# Patient Record
Sex: Female | Born: 2000 | Race: White | Hispanic: Yes | Marital: Single | Smoking: Never smoker
Health system: Southern US, Community
[De-identification: ages and names within clinical notes are randomized; demographics above are authoritative.]

## PROBLEM LIST (undated history)

## (undated) ENCOUNTER — Inpatient Hospital Stay: Payer: Self-pay

## (undated) DIAGNOSIS — D649 Anemia, unspecified: Secondary | ICD-10-CM

## (undated) DIAGNOSIS — J45909 Unspecified asthma, uncomplicated: Secondary | ICD-10-CM

## (undated) HISTORY — PX: TONSILLECTOMY: SUR1361

---
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
## 2022-09-08 IMAGING — US US OB COMP LESS 14 WK
1 series · 15 of 28 positions shown · non-contrast
Comparison: None.

CLINICAL DATA: Unsure dates of pregnancy. Gestational age by unsure
last menstrual period of 15 weeks and 6 days.

EXAM:
OBSTETRIC <14 WK US AND TRANSVAGINAL OB US
TECHNIQUE: Both transabdominal and transvaginal ultrasound examinations were
performed for complete evaluation of the gestation as well as the
maternal uterus, adnexal regions, and pelvic cul-de-sac.
Transvaginal technique was performed to assess early pregnancy.

[Series 1: us ob comp less 14 wk · 46 acquisitions, 15 frames shown]
[im 1/46]
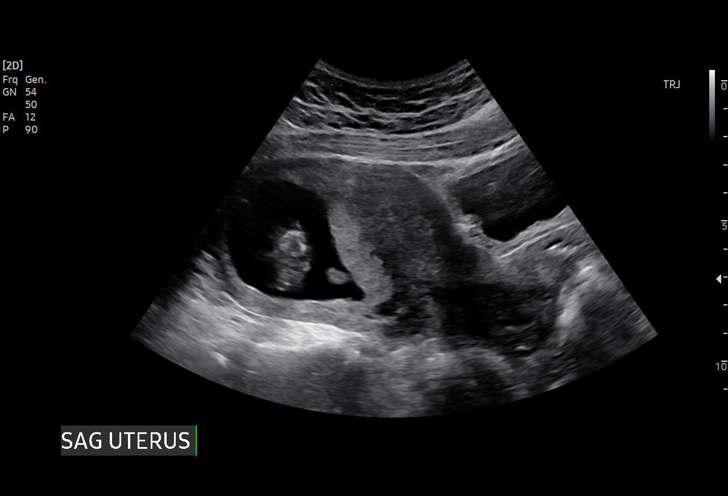
[im 4/46]
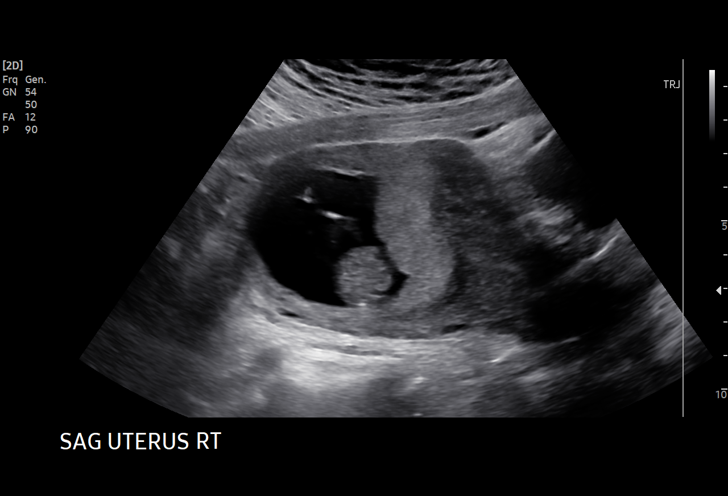
[im 7/46]
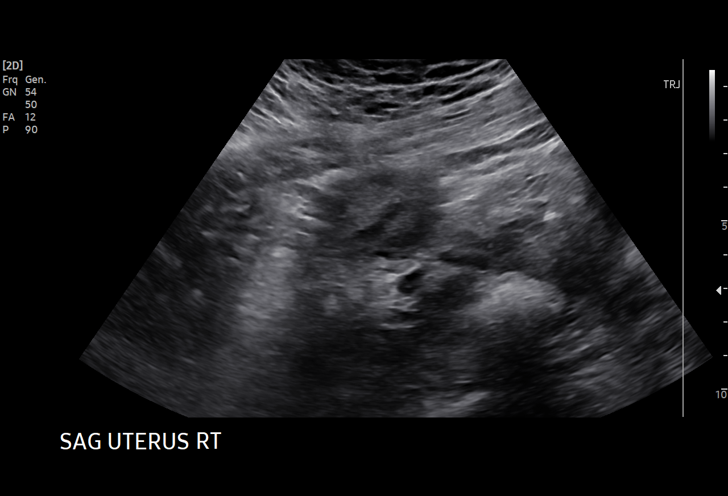
[im 11/46]
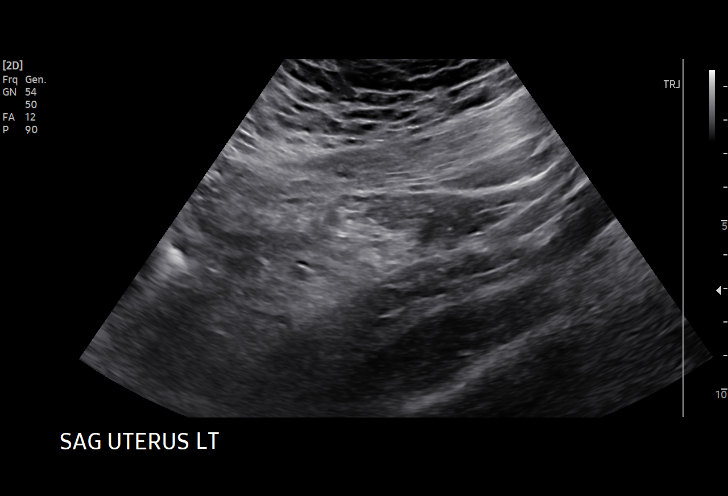
[im 14/46]
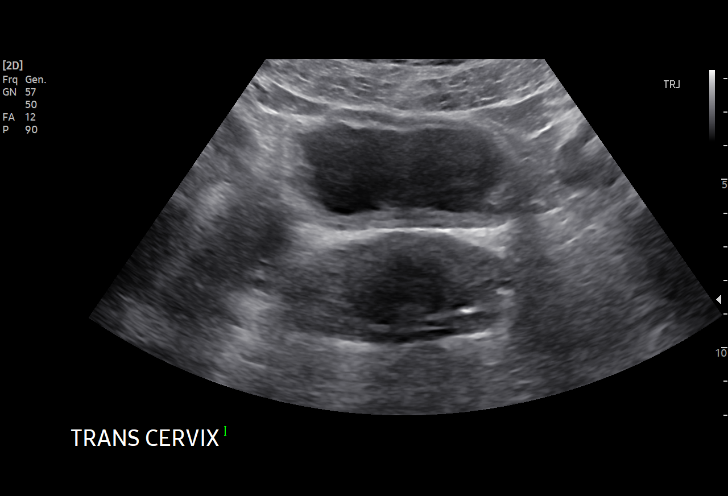
[im 17/46]
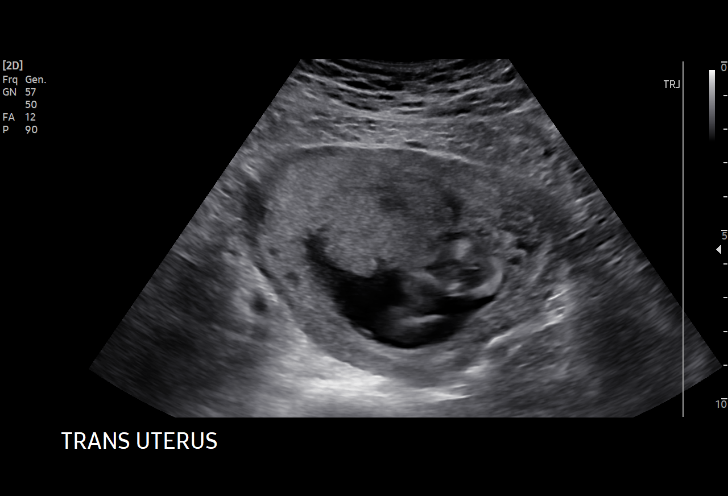
[im 21/46]
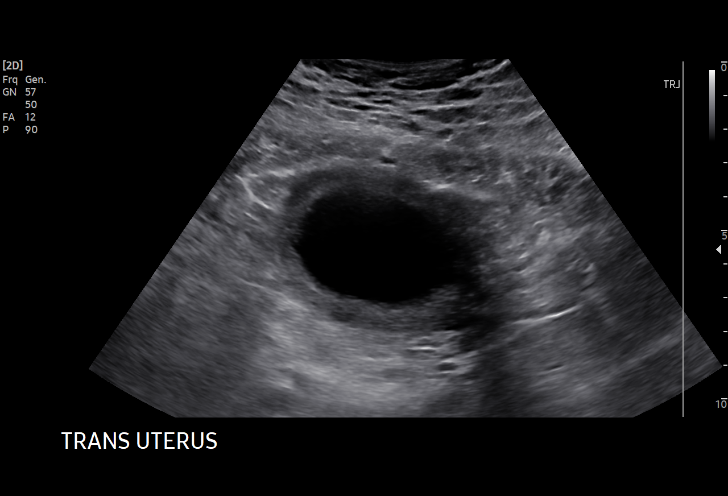
[im 24/46]
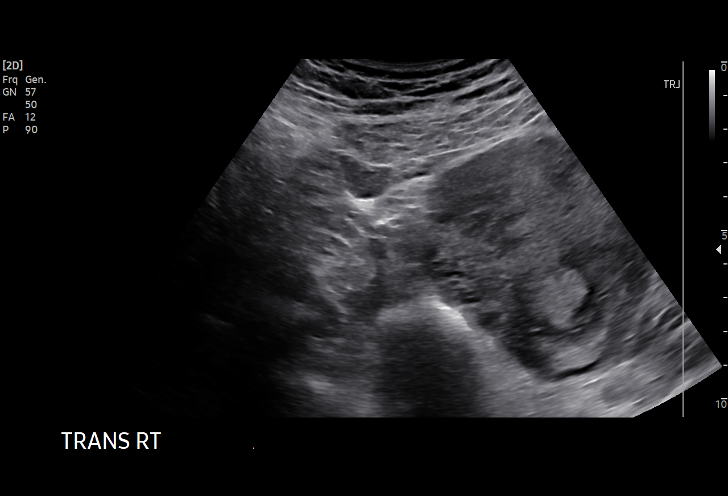
[im 26/46]
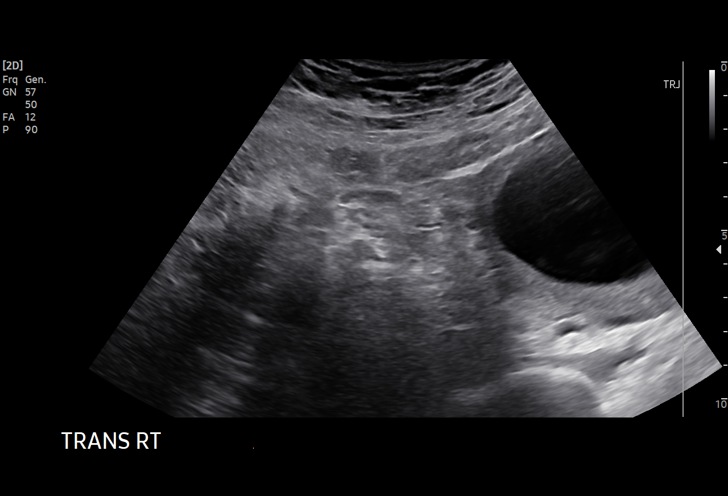
[im 29/46]
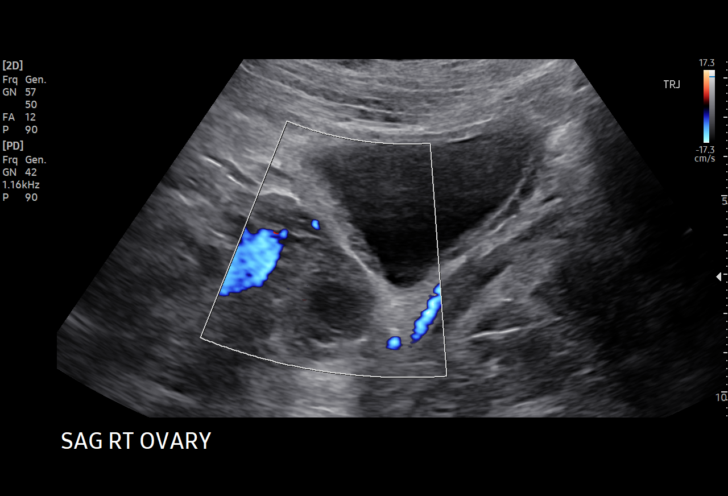
[im 32/46]
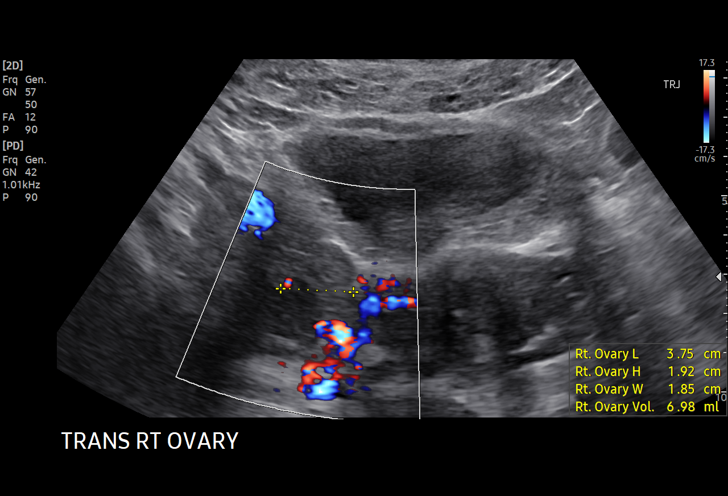
[im 36/46]
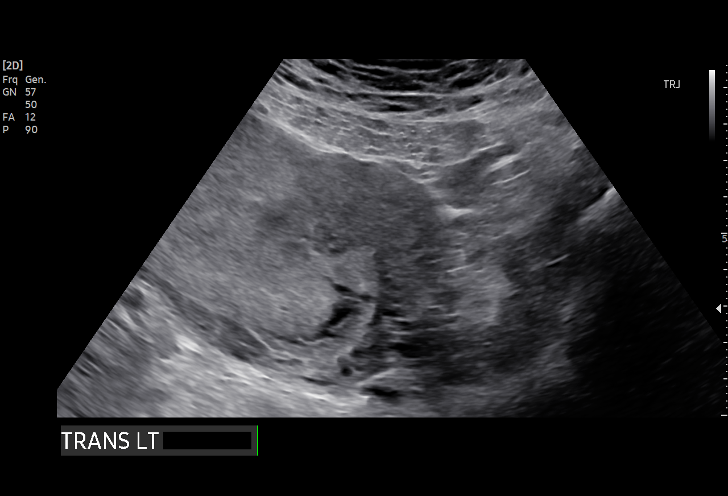
[im 39/46]
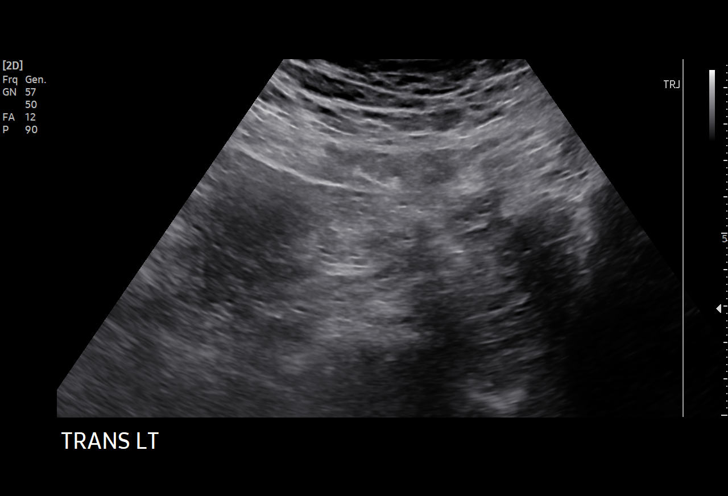
[im 42/46]
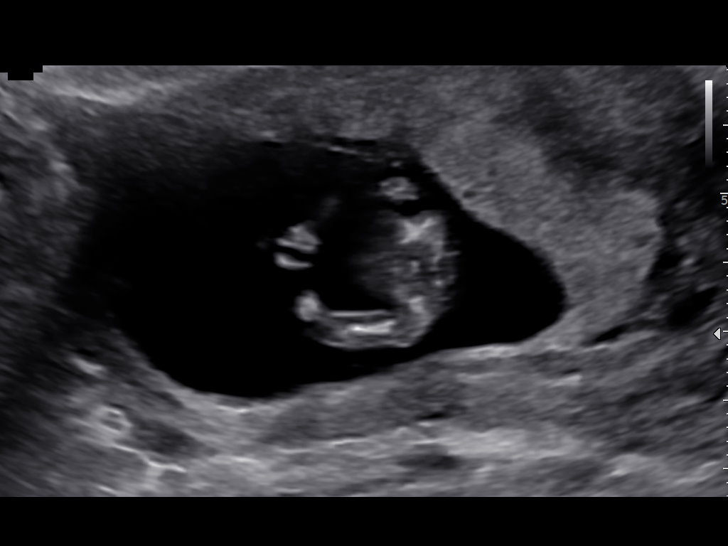
[im 46/46]
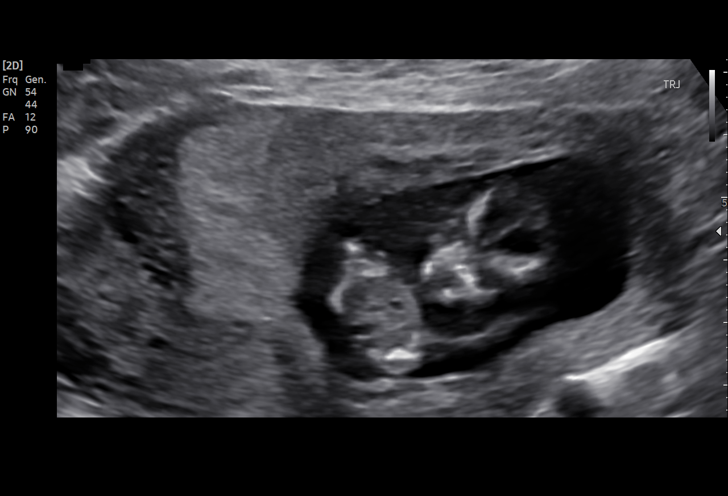

[15 of 28 positions shown; findings below may reference images not displayed]

FINDINGS: Intrauterine gestational sac: Single

Yolk sac:  Not Visualized.

Embryo:  Visualized.

Cardiac Activity: Visualized.

Heart Rate: 150 bpm

CRL:  60.4 mm   12 w   4 d                  US EDC: 01/13/2021

Subchorionic hemorrhage:  None visualized.

Maternal uterus/adnexae: The left ovary is not visualized. No
adnexal mass noted in the left adnexa. A corpus luteum cyst is noted
within the right ovary. The right ovary is otherwise unremarkable.

Other: No free pelvic fluid.
IMPRESSION: Single live intrauterine pregnancy with a gestational age of 12
weeks and 4 days by ultrasound which is not concordant with
gestational age by last menstrual period of 15 weeks and 6 days.

## 2022-10-17 IMAGING — CR DG CHEST 2V
1 series · 2 of 2 positions shown · non-contrast
Comparison: None.

CLINICAL DATA: Chest pain and shortness of breath.

EXAM:
CHEST - 2 VIEW

[Series 1: dg chest 2 view · 0.14mm/px · 2 of 2 slices shown]
[im 1/2]
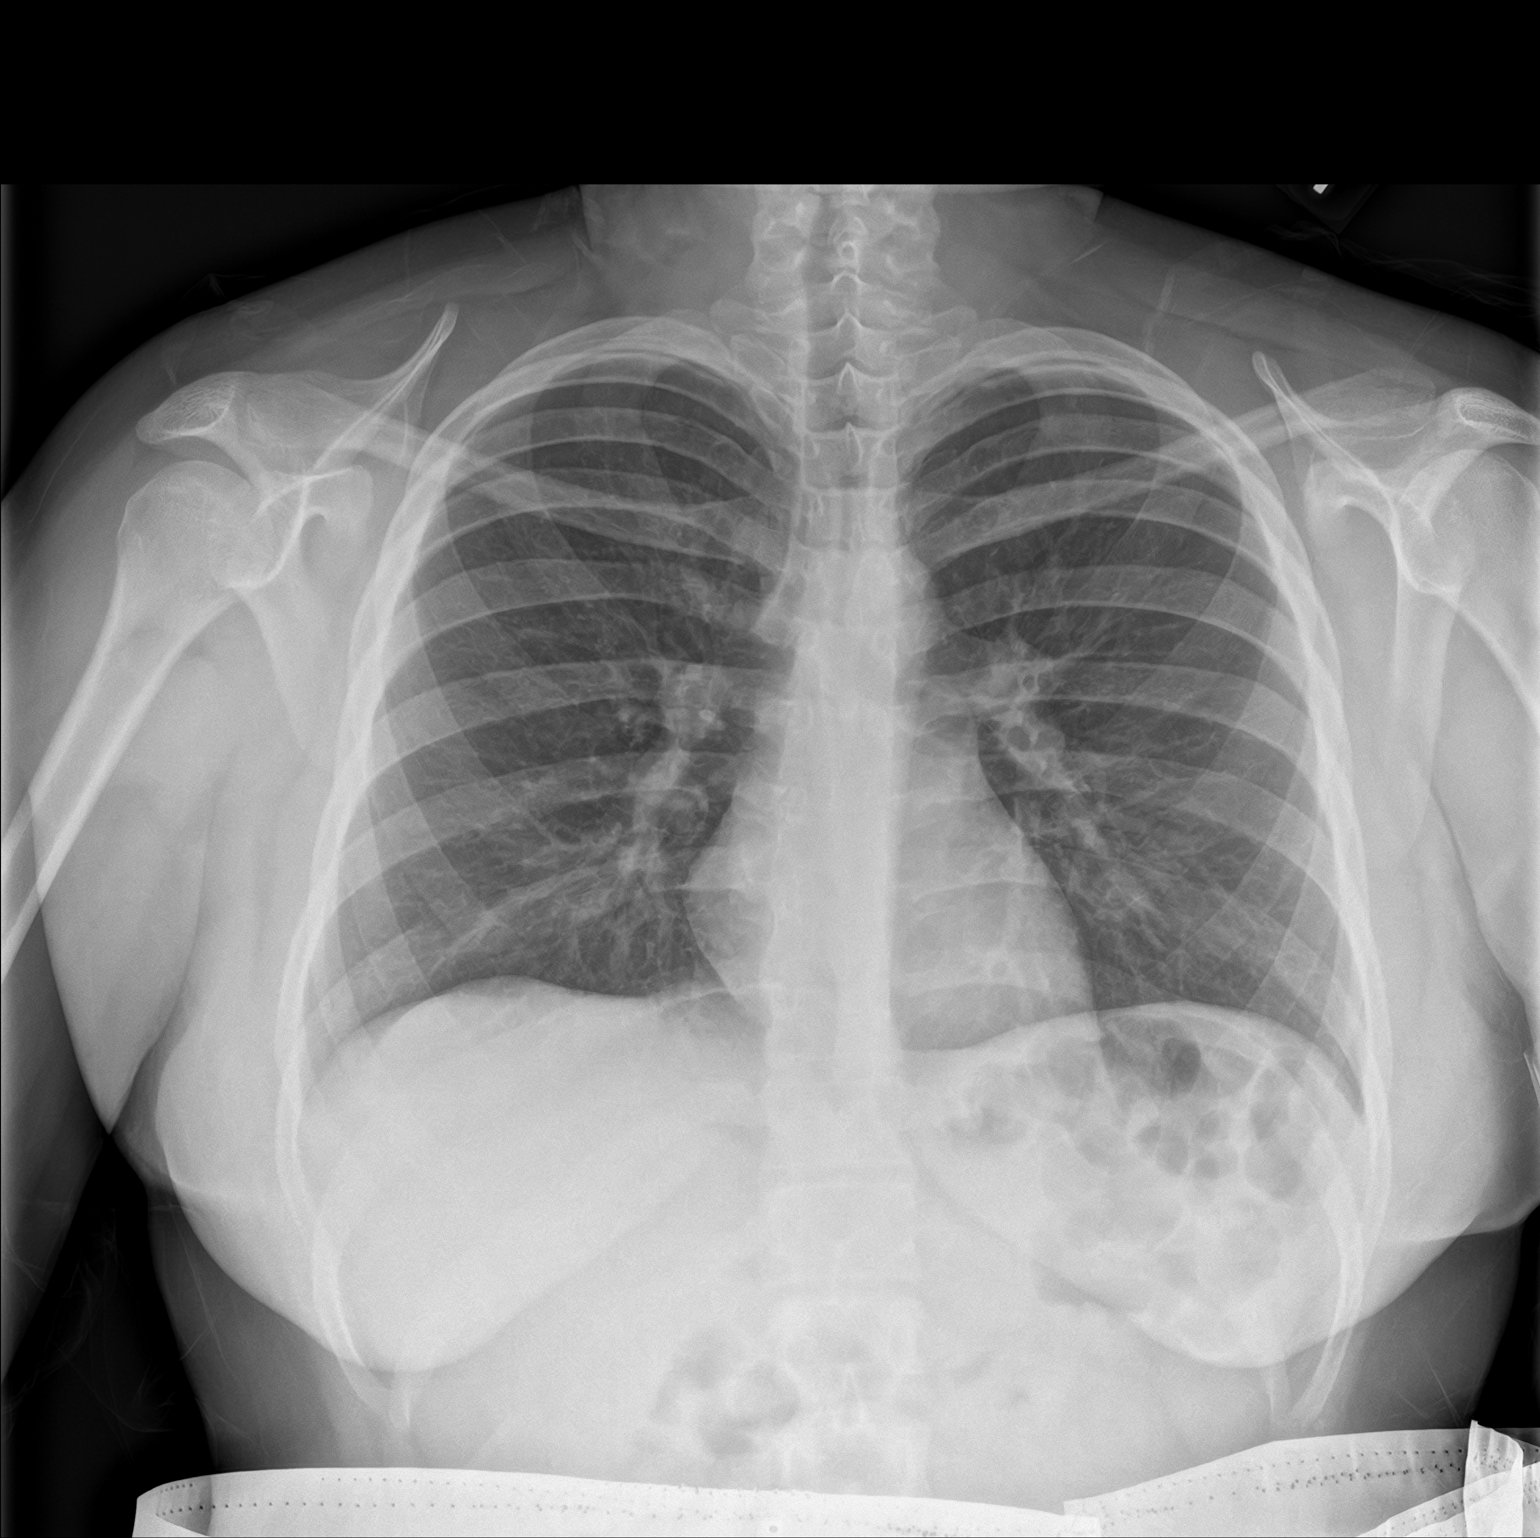
[im 2/2]
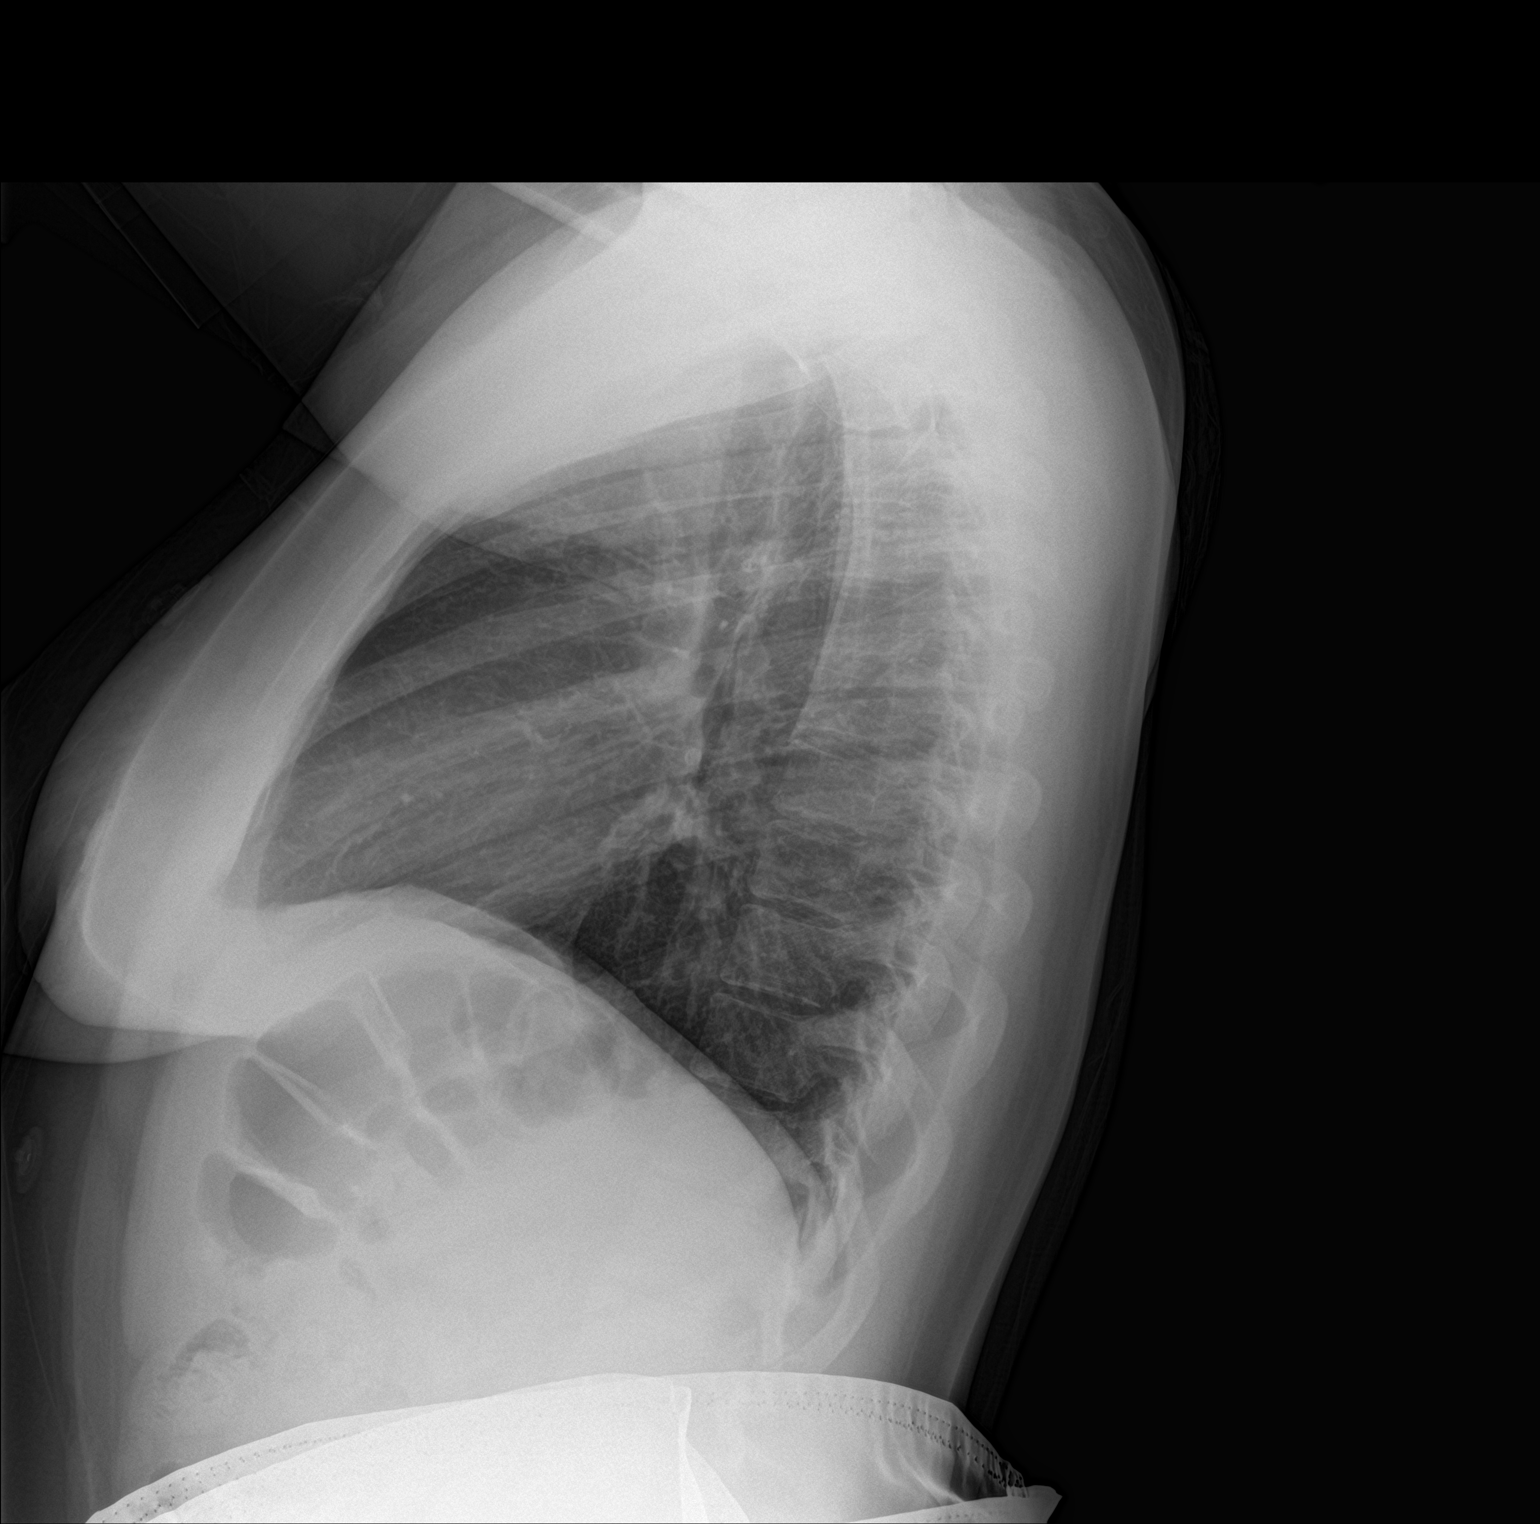

[2 of 2 positions shown; findings below may reference images not displayed]

FINDINGS: The heart size and mediastinal contours are within normal limits.
Both lungs are clear. The visualized skeletal structures are
unremarkable.
IMPRESSION: No active cardiopulmonary disease.

## 2022-12-18 ENCOUNTER — Other Ambulatory Visit: Payer: Self-pay | Admitting: Family Medicine

## 2022-12-18 DIAGNOSIS — Z3201 Encounter for pregnancy test, result positive: Secondary | ICD-10-CM

## 2022-12-27 ENCOUNTER — Other Ambulatory Visit: Payer: Self-pay | Admitting: Family Medicine

## 2022-12-27 ENCOUNTER — Ambulatory Visit
Admission: RE | Admit: 2022-12-27 | Discharge: 2022-12-27 | Disposition: A | Discharge status: Home / Self Care | Payer: Medicaid Other | Source: Ambulatory Visit | Attending: Family Medicine | Admitting: Family Medicine

## 2022-12-27 DIAGNOSIS — Z3201 Encounter for pregnancy test, result positive: Secondary | ICD-10-CM | POA: Diagnosis present

## 2023-01-11 LAB — OB RESULTS CONSOLE RUBELLA ANTIBODY, IGM: Rubella: IMMUNE

## 2023-01-11 LAB — OB RESULTS CONSOLE HEPATITIS B SURFACE ANTIGEN: Hepatitis B Surface Ag: NEGATIVE

## 2023-01-11 LAB — OB RESULTS CONSOLE GC/CHLAMYDIA
Chlamydia: NEGATIVE
Neisseria Gonorrhea: NEGATIVE

## 2023-01-11 LAB — OB RESULTS CONSOLE HIV ANTIBODY (ROUTINE TESTING): HIV: NONREACTIVE

## 2023-01-11 LAB — OB RESULTS CONSOLE VARICELLA ZOSTER ANTIBODY, IGG: Varicella: IMMUNE

## 2023-01-17 NOTE — L&D Delivery Note (Signed)
 Delivery Note  Date of delivery: 06/22/2023 Estimated Date of Delivery: 06/16/23 Patient's last menstrual period was 09/12/2022 (approximate). EGA: [redacted]w[redacted]d  Delivery Note At 4:00 PM a viable female was delivered via Vaginal, Spontaneous (Presentation: Left Occiput Anterior).  APGAR: 8, 9; weight pending.  Placenta status: Spontaneous, Intact.  Cord: 3 vessels with the following complications: None.    First Stage: Labor onset: unknown Augmentation : Pitocin , AROM Analgesia Darcie Easterly intrapartum: Epidural AROM at 1440  Yadhira M Torres Hernandez presented to L&D with SROM. She was augmented with pitocin  and AROM. Epidural placed for pain relief.   Second Stage: Complete dilation at 1556 Onset of pushing at 1556 FHR second stage Cat II Delivery at 1600 on 06/22/2023  She progressed to complete and had a spontaneous vaginal birth of a live female over an intact perineum. The fetal head was delivered in OA position with restitution to LOA. No nuchal cord. Anterior then posterior shoulders delivered spontaneously with minimal assistance. Baby placed on mom's abdomen and attended to by transition RN. Cord clamped and cut after 1+ min by FOB. Cord blood obtained for newborn labs.  Third Stage: Placenta delivered intact with 3VC at 1605 Placenta disposition: routine disposal Uterine tone firm / bleeding min IV pitocin  given for hemorrhage prophylaxis  Anesthesia: Epidural Episiotomy: None Lacerations: Labial Suture Repair: 3.0 vicryl Est. Blood Loss (mL):  100  Complications: none  Mom to postpartum.  Baby to Couplet care / Skin to Skin.  Newborn: Birth Weight: pending  Apgar Scores: 8, 9 Feeding planned: Bottle   Aron Lard, CNM 06/22/2023 4:20 PM

## 2023-01-18 ENCOUNTER — Other Ambulatory Visit: Payer: Self-pay | Admitting: Primary Care

## 2023-01-18 DIAGNOSIS — Z3482 Encounter for supervision of other normal pregnancy, second trimester: Secondary | ICD-10-CM

## 2023-02-05 ENCOUNTER — Ambulatory Visit
Admission: RE | Admit: 2023-02-05 | Discharge: 2023-02-05 | Disposition: A | Discharge status: Home / Self Care | Payer: Medicaid Other | Source: Ambulatory Visit | Attending: Primary Care | Admitting: Primary Care

## 2023-02-05 DIAGNOSIS — Z3482 Encounter for supervision of other normal pregnancy, second trimester: Secondary | ICD-10-CM | POA: Diagnosis present

## 2023-02-05 DIAGNOSIS — Z3A21 21 weeks gestation of pregnancy: Secondary | ICD-10-CM | POA: Diagnosis not present

## 2023-02-05 DIAGNOSIS — Z3689 Encounter for other specified antenatal screening: Secondary | ICD-10-CM | POA: Insufficient documentation

## 2023-05-01 ENCOUNTER — Encounter: Payer: Self-pay | Admitting: Obstetrics and Gynecology

## 2023-05-01 ENCOUNTER — Observation Stay
Admission: EM | Admit: 2023-05-01 | Discharge: 2023-05-01 | Disposition: A | Discharge status: Home / Self Care | Attending: Obstetrics and Gynecology | Admitting: Obstetrics and Gynecology

## 2023-05-01 DIAGNOSIS — Z79899 Other long term (current) drug therapy: Secondary | ICD-10-CM | POA: Diagnosis not present

## 2023-05-01 DIAGNOSIS — O4703 False labor before 37 completed weeks of gestation, third trimester: Secondary | ICD-10-CM | POA: Diagnosis present

## 2023-05-01 DIAGNOSIS — O99513 Diseases of the respiratory system complicating pregnancy, third trimester: Secondary | ICD-10-CM | POA: Diagnosis not present

## 2023-05-01 DIAGNOSIS — O26893 Other specified pregnancy related conditions, third trimester: Secondary | ICD-10-CM | POA: Diagnosis present

## 2023-05-01 DIAGNOSIS — Z3A33 33 weeks gestation of pregnancy: Secondary | ICD-10-CM | POA: Insufficient documentation

## 2023-05-01 DIAGNOSIS — M545 Low back pain, unspecified: Secondary | ICD-10-CM | POA: Insufficient documentation

## 2023-05-01 DIAGNOSIS — J45909 Unspecified asthma, uncomplicated: Secondary | ICD-10-CM | POA: Diagnosis not present

## 2023-05-01 DIAGNOSIS — O479 False labor, unspecified: Principal | ICD-10-CM | POA: Diagnosis present

## 2023-05-01 DIAGNOSIS — O99891 Other specified diseases and conditions complicating pregnancy: Secondary | ICD-10-CM | POA: Insufficient documentation

## 2023-05-01 LAB — URINALYSIS, COMPLETE (UACMP) WITH MICROSCOPIC
Bilirubin Urine: NEGATIVE
Glucose, UA: NEGATIVE mg/dL
Hgb urine dipstick: NEGATIVE
Ketones, ur: 5 mg/dL — AB
Nitrite: NEGATIVE
Protein, ur: 30 mg/dL — AB
Specific Gravity, Urine: 1.03 (ref 1.005–1.030)
pH: 6 (ref 5.0–8.0)

## 2023-05-01 MED ORDER — NITROFURANTOIN MONOHYD MACRO 100 MG PO CAPS: 100.0000 mg | ORAL_CAPSULE | Freq: Two times a day (BID) | ORAL | 0 refills | Status: AC

## 2023-05-01 MED ORDER — NITROFURANTOIN MONOHYD MACRO 100 MG PO CAPS
100.0000 mg | ORAL_CAPSULE | Freq: Two times a day (BID) | ORAL | Status: DC
  Administered 2023-05-01: 100 mg via ORAL
  Filled 2023-05-01: qty 1

## 2023-05-01 MED ORDER — ZOLPIDEM TARTRATE 5 MG PO TABS: 5.0000 mg | ORAL_TABLET | Freq: Every evening | ORAL | Status: DC | PRN

## 2023-05-01 MED ORDER — ACETAMINOPHEN 500 MG PO TABS
ORAL_TABLET | ORAL | Status: DC
Start: 2023-05-01 — End: 2023-05-02
  Filled 2023-05-01: qty 2

## 2023-05-01 MED ORDER — ACETAMINOPHEN 500 MG PO TABS
1000.0000 mg | ORAL_TABLET | Freq: Four times a day (QID) | ORAL | Status: DC | PRN
  Administered 2023-05-01: 1000 mg via ORAL

## 2023-05-01 MED ORDER — CALCIUM CARBONATE ANTACID 500 MG PO CHEW: 2.0000 | CHEWABLE_TABLET | ORAL | Status: DC | PRN

## 2023-05-01 NOTE — OB Triage Note (Signed)
 Pt is G2p1 at approx 33 weeks with c/o headache, upper abd cramping and intermittent back pain. States she had PTL with first baby but delivered at term. Reports + FM FHR 145

## 2023-05-01 NOTE — Discharge Instructions (Signed)

## 2023-05-01 NOTE — Discharge Summary (Signed)
 Monique Hansen Monique Hansen is a 23 y.o. female. She is at [redacted]w[redacted]d gestation. No LMP recorded. Patient is pregnant. 06/16/2023, by Other Basis   Prenatal care site: Monique Hansen  Chief complaint: lower back pain and contractions   Admission Diagnoses:  1) intrauterine pregnancy at [redacted]w[redacted]d  2) Uterine contractions during pregnancy [O47.9]  Discharge Diagnoses:  Principal Problem:   Uterine contractions during pregnancy Active Problems:   Low back pain during pregnancy in third trimester   HPI: Monique Hansen presents to L&D with complaints of contractions and lower back pain. Started earlier this evening around 1800 after work. She reports a history of preterm contractions in her previous pregnancy but delivered at term. Her pregnancy is complicated by obesity. Currently receives care with Monique Hansen. Records available through January 2025 She denies Loss of fluid or Vaginal bleeding. Endorses fetal movement as active. States contractions are less frequent and back pain has improved since arrival to L&D  S: Resting comfortably., no VB.no LOF,  Active fetal movement.   Maternal Medical History:  Past Medical Hx:  has a past medical history of Anemia and Asthma.    Past Surgical Hx:  has a past surgical history that includes Tonsillectomy.   Allergies  Allergen Reactions   Ibuprofen Hives     Prior to Admission medications   Medication Sig Start Date End Date Taking? Authorizing Provider  acetaminophen (TYLENOL) 500 MG tablet Take 2 tablets (1,000 mg total) by mouth every 6 (six) hours as needed (for pain scale < 4). 01/18/21   Auston Left, CNM  albuterol (PROVENTIL) (2.5 MG/3ML) 0.083% nebulizer solution Take 2.5 mg by nebulization as needed for wheezing or shortness of breath.    [provider]  benzocaine-Menthol (DERMOPLAST) 20-0.5 % AERO Apply 1 application topically as needed for irritation (perineal discomfort). 01/18/21   Auston Left, CNM  diphenhydrAMINE  (BENADRYL) 25 MG tablet Take 2 tablets (50 mg total) by mouth every 6 (six) hours as needed for itching. 12/11/20   McVey, Georges Kings, CNM  ferrous sulfate 325 (65 FE) MG tablet Take 1 tablet (325 mg total) by mouth 2 (two) times daily with a meal. 01/18/21   Wilson, Prudy Brownie, CNM  nitrofurantoin, macrocrystal-monohydrate, (MACROBID) 100 MG capsule Take 1 capsule (100 mg total) by mouth every 12 (twelve) hours for 5 days. 05/01/23 05/06/23  Teodora Fell, CNM  Prenatal Vit-Fe Fumarate-FA (MULTIVITAMIN-PRENATAL) 27-0.8 MG TABS tablet Take 1 tablet by mouth daily at 12 noon.    [provider]  sodium chloride (OCEAN) 0.65 % SOLN nasal spray Place 1 spray into both nostrils as needed for congestion. 12/11/20   McVey, Georges Kings, CNM  witch hazel-glycerin (TUCKS) pad Apply 1 application topically as needed for hemorrhoids (for pain). 01/18/21   Auston Left, CNM    Social History: She  reports that she has never smoked. She has never used smokeless tobacco. She reports that she does not currently use alcohol. She reports that she does not use drugs.  Family History: family history is not on file.   Review of Systems: A full review of systems was performed and negative except as noted in the HPI.     Pertinent Results:   O:  Temp 98.2 F (36.8 C)   Resp 16   Ht 5\' 2"  (1.575 m)   Wt 95.3 kg   BMI 38.41 kg/m  Results for orders placed or performed during the hospital encounter of 05/01/23 (from the past 48 hours)  Urinalysis,  Complete w Microscopic -Urine, Clean Catch   Collection Time: 05/01/23  9:25 PM  Result Value Ref Range   Color, Urine YELLOW (A) YELLOW   APPearance HAZY (A) CLEAR   Specific Gravity, Urine 1.030 1.005 - 1.030   pH 6.0 5.0 - 8.0   Glucose, UA NEGATIVE NEGATIVE mg/dL   Hgb urine dipstick NEGATIVE NEGATIVE   Bilirubin Urine NEGATIVE NEGATIVE   Ketones, ur 5 (A) NEGATIVE mg/dL   Protein, ur 30 (A) NEGATIVE mg/dL   Nitrite NEGATIVE NEGATIVE    Leukocytes,Ua TRACE (A) NEGATIVE   RBC / HPF 0-5 0 - 5 RBC/hpf   WBC, UA 6-10 0 - 5 WBC/hpf   Bacteria, UA RARE (A) NONE SEEN   Squamous Epithelial / HPF 6-10 0 - 5 /HPF   Mucus PRESENT     No results found.  Constitutional: NAD, AAOx3  PULM: nl respiratory effort Abd: gravid, non-tender Ext: Non-tender, Nonedmeatous Psych: mood appropriate, speech normal SVE: Dilation: Closed Exam by:: MackieCNM   NST: Baseline FHR: 135 beats/min Variability: moderate Accelerations: present Decelerations: absent Tocometry: Occasional, mild contractions  Time: at least 20 minutes   Interpretation: Category I INDICATIONS: rule out uterine contractions RESULTS:  A NST procedure was performed with FHR monitoring and a normal baseline established, appropriate time of 20-40 minutes of evaluation, and accels >2 seen w 15x15 characteristics.  Results show a REACTIVE NST.   Consults: None  Procedures: NST  Hospital Course: The patient was admitted to Labor and Delivery Triage for observation. She reported improvement in her symptoms with rest and hydration. States contractions were infrequent and back pain had improved. Discussed possible UTI based on UA results. Urine culture pending. Will start Macrobid now while urine culture is pending. Reviewed preterm labor warning s/s to return to L&D with.   She was deemed Monique for discharge and further outpatient management.   Discharge Condition: Monique  Disposition: Discharge disposition: 01-Home or Self Care       Allergies as of 05/01/2023       Reactions   Ibuprofen Hives        Medication List     STOP taking these medications    benzocaine-Menthol 20-0.5 % Aero Commonly known as: DERMOPLAST   witch hazel-glycerin pad Commonly known as: TUCKS       TAKE these medications    acetaminophen 500 MG tablet Commonly known as: TYLENOL Take 2 tablets (1,000 mg total) by mouth every 6 (six) hours as needed (for pain scale < 4).    albuterol (2.5 MG/3ML) 0.083% nebulizer solution Commonly known as: PROVENTIL Take 2.5 mg by nebulization as needed for wheezing or shortness of breath.   diphenhydrAMINE 25 MG tablet Commonly known as: BENADRYL Take 2 tablets (50 mg total) by mouth every 6 (six) hours as needed for itching.   ferrous sulfate 325 (65 FE) MG tablet Take 1 tablet (325 mg total) by mouth 2 (two) times daily with a meal.   multivitamin-prenatal 27-0.8 MG Tabs tablet Take 1 tablet by mouth daily at 12 noon.   nitrofurantoin (macrocrystal-monohydrate) 100 MG capsule Commonly known as: MACROBID Take 1 capsule (100 mg total) by mouth every 12 (twelve) hours for 5 days.   sodium chloride 0.65 % Soln nasal spray Commonly known as: OCEAN Place 1 spray into both nostrils as needed for congestion.        Follow-up Information     [facility]. Go in 1 day(s).   Specialty: General Practice Why: routine  prenatal appointment Contact information: [address] [place] [address] [phone]                ----- Teodora Fell, CNM  Certified Nurse Midwife [place]  [facility]

## 2023-05-03 LAB — URINE CULTURE

## 2023-05-11 ENCOUNTER — Encounter (HOSPITAL_BASED_OUTPATIENT_CLINIC_OR_DEPARTMENT_OTHER): Payer: Self-pay | Admitting: Primary Care

## 2023-05-11 ENCOUNTER — Other Ambulatory Visit: Payer: Self-pay | Admitting: Primary Care

## 2023-05-11 DIAGNOSIS — Z3483 Encounter for supervision of other normal pregnancy, third trimester: Secondary | ICD-10-CM

## 2023-05-22 ENCOUNTER — Ambulatory Visit

## 2023-05-22 LAB — OB RESULTS CONSOLE GBS
GBS: POSITIVE
GBS: POSITIVE

## 2023-05-31 ENCOUNTER — Ambulatory Visit
Admission: RE | Admit: 2023-05-31 | Discharge: 2023-05-31 | Disposition: A | Discharge status: Home / Self Care | Source: Ambulatory Visit | Attending: Primary Care | Admitting: Primary Care

## 2023-05-31 DIAGNOSIS — Z3483 Encounter for supervision of other normal pregnancy, third trimester: Secondary | ICD-10-CM | POA: Diagnosis present

## 2023-06-20 ENCOUNTER — Other Ambulatory Visit: Payer: Self-pay

## 2023-06-20 ENCOUNTER — Observation Stay
Admission: EM | Admit: 2023-06-20 | Discharge: 2023-06-20 | Disposition: A | Discharge status: Home / Self Care | Attending: Obstetrics and Gynecology | Admitting: Obstetrics and Gynecology

## 2023-06-20 ENCOUNTER — Observation Stay
Admission: EM | Admit: 2023-06-20 | Discharge: 2023-06-20 | Disposition: A | Discharge status: Home / Self Care | Source: Home / Self Care | Admitting: Obstetrics and Gynecology

## 2023-06-20 DIAGNOSIS — E669 Obesity, unspecified: Secondary | ICD-10-CM | POA: Diagnosis not present

## 2023-06-20 DIAGNOSIS — O480 Post-term pregnancy: Principal | ICD-10-CM | POA: Diagnosis present

## 2023-06-20 DIAGNOSIS — Z79899 Other long term (current) drug therapy: Secondary | ICD-10-CM | POA: Diagnosis not present

## 2023-06-20 DIAGNOSIS — O99513 Diseases of the respiratory system complicating pregnancy, third trimester: Secondary | ICD-10-CM | POA: Diagnosis not present

## 2023-06-20 DIAGNOSIS — M549 Dorsalgia, unspecified: Secondary | ICD-10-CM | POA: Insufficient documentation

## 2023-06-20 DIAGNOSIS — J45909 Unspecified asthma, uncomplicated: Secondary | ICD-10-CM | POA: Insufficient documentation

## 2023-06-20 DIAGNOSIS — O99213 Obesity complicating pregnancy, third trimester: Secondary | ICD-10-CM | POA: Diagnosis not present

## 2023-06-20 DIAGNOSIS — M545 Low back pain, unspecified: Secondary | ICD-10-CM | POA: Diagnosis not present

## 2023-06-20 DIAGNOSIS — O479 False labor, unspecified: Secondary | ICD-10-CM | POA: Diagnosis present

## 2023-06-20 DIAGNOSIS — O26893 Other specified pregnancy related conditions, third trimester: Secondary | ICD-10-CM | POA: Insufficient documentation

## 2023-06-20 DIAGNOSIS — O471 False labor at or after 37 completed weeks of gestation: Principal | ICD-10-CM | POA: Insufficient documentation

## 2023-06-20 DIAGNOSIS — Z3A40 40 weeks gestation of pregnancy: Secondary | ICD-10-CM | POA: Diagnosis not present

## 2023-06-20 NOTE — OB Triage Note (Signed)
 Pt is G2P1 at 40.4 with c/o intermittent UCs since 0530am. Nothing consistent. Has a scheduled appoint today at 3pm. FHR 135

## 2023-06-20 NOTE — OB Triage Note (Signed)
Pt discharged home per order.   Pt stable and ambulatory and an After Visit Summary was printed and given to the patient. Discharge education completed with patient/family including follow up instructions, appointments, and medication list. Pt received labor and bleeding precautions. Patient able to verbalize understanding, all questions fully answered upon discharge.   Pt discharged home via personal vehicle with support person.  

## 2023-06-20 NOTE — Progress Notes (Signed)
   06/20/23 1728  Fetal Heart Rate A  Mode External  Baseline Rate (A) 130 bpm  Variability 6-25 BPM  Accelerations 15 x 15  Decelerations None  Uterine Activity  Mode Toco  Contraction Frequency (min) ctx x2  Contraction Duration (sec) 60-120  Contraction Quality Mild  Resting Tone Palpated Relaxed  Resting Time Adequate   Reactive fetal tracing. Provider reviewed.

## 2023-06-20 NOTE — Discharge Summary (Signed)
 Monique Hansen Stable Monique Hansen is a 23 y.o. female. She is at [redacted]w[redacted]d gestation. Patient's last menstrual period was 09/12/2022 (approximate). 06/16/2023, by Ultrasound   Prenatal care site: [facility]  Chief complaint: back pain and would like to schedule induction of labor   Admission Diagnoses:  1) intrauterine pregnancy at [redacted]w[redacted]d  2) Post-dates pregnancy [O48.0]  Discharge Diagnoses:  Principal Problem:   Post-dates pregnancy  Back pain in pregnancy   HPI: [name] presents to L&D with complaints of lower back pain and desire to discuss induction of labor. She was seen by her prenatal provider this afternoon and request for induction of labor was sent to [facility], has not been scheduled yet.  States she had lower back pain and pelvic pressure. She was seen in L&D triage last night for similar concerns. Her pregnancy is uncomplicated.  She denies Contractions, Loss of fluid, or Vaginal bleeding. Endorses fetal movement as active.   S: Resting comfortably. no CTX, no VB.no LOF,  Active fetal movement.   Maternal Medical History:  Past Medical Hx:  has a past medical history of Anemia and Asthma.    Past Surgical Hx:  has a past surgical history that includes Tonsillectomy.   Allergies  Allergen Reactions   Ibuprofen Hives     Prior to Admission medications   Medication Sig Start Date End Date Taking? Authorizing Provider  acetaminophen  (TYLENOL ) 500 MG tablet Take 2 tablets (1,000 mg total) by mouth every 6 (six) hours as needed (for pain scale < 4). 01/18/21   Auston Left, CNM  albuterol (PROVENTIL) (2.5 MG/3ML) 0.083% nebulizer solution Take 2.5 mg by nebulization as needed for wheezing or shortness of breath.    [provider]  diphenhydrAMINE  (BENADRYL ) 25 MG tablet Take 2 tablets (50 mg total) by mouth every 6 (six) hours as needed for itching. 12/11/20   McVey, Georges Kings, CNM  ferrous sulfate  325 (65 FE) MG tablet Take 1 tablet (325 mg total) by mouth 2 (two) times daily  with a meal. 01/18/21   Wilson, Prudy Brownie, CNM  Prenatal Vit-Fe Fumarate-FA (MULTIVITAMIN-PRENATAL) 27-0.8 MG TABS tablet Take 1 tablet by mouth daily at 12 noon.    [provider]  sodium chloride  (OCEAN) 0.65 % SOLN nasal spray Place 1 spray into both nostrils as needed for congestion. 12/11/20   McVey, Georges Kings, CNM    Social History: She  reports that she has never smoked. She has never used smokeless tobacco. She reports that she does not currently use alcohol. She reports that she does not use drugs.  Family History: family history is not on file.   Review of Systems: A full review of systems was performed and negative except as noted in the HPI.     Pertinent Results:   O:  Ht 5\' 2"  (1.575 m)   Wt 98.4 kg   LMP 09/12/2022 (Approximate)   BMI 39.69 kg/m  No results found for this or any previous visit (from the past 48 hours).  US  OB Follow Up Result Date: 06/10/2023 CLINICAL DATA:  Complete fetal anatomic survey EXAM: OBSTETRIC 14+ WK ULTRASOUND FOLLOW-UP FINDINGS: Number of Fetuses: 1 Heart Rate:  147 bpm Movement: Yes Presentation: Cephalic Previa: No Placental Location: Fundal Amniotic Fluid (Subjective): Normal Amniotic Fluid (Objective): Vertical pocket 5.6cm AFI 15.5 cm (5%ile= 7.5 cm, 95%= 24.4 cm for 37 wks) FETAL BIOMETRY BPD:  9.3cm 38w 0d HC:    34.4cm 39w 5d AC:    35.1cm 39w 0d FL:  7.2cm 36w 6d Current Mean GA: 37w 5d US  EDC: 06/16/2023 Assigned GA: 37w 5d Assigned EDC: 06/16/2023 Estimated Fetal Weight:  3508.7g 79.4%ile FETAL ANATOMY Lateral Ventricles: Appears normal Thalami/CSP: Appears normal Posterior Fossa:  Not visualized Nuchal Region: Not visualized   NFT= N/A > 20 WKS Upper Lip: Appears normal Spine: Appears normal 4 Chamber Heart on Left: Appears normal LVOT: Appears normal RVOT: Appears normal Stomach on Left: Appears normal 3 Vessel Cord: Appears normal Cord Insertion site: Previously seen Kidneys: Appears normal Bladder: Appears normal  Extremities: Previously seen Sex: Previously Seen Technical Limitations: Fetal position, Vance gestational age Maternal Findings: Cervix:  3.6 cm IMPRESSION: 1. Single live intrauterine pregnancy as above, estimated age 32 weeks and 5 days. 2. Persistent suboptimal visualization of the posterior fossa and nuchal region. The upper lip, LVOT, RVOT, and kidneys are well visualized on this exam, and were not seen previously. In Electronically Signed   By: Bobbye Burrow M.D.   On: 06/10/2023 20:42    Constitutional: NAD, AAOx3  PULM: nl respiratory effort Abd: gravid, non-tender Ext: Non-tender, Nonedmeatous Psych: mood appropriate, speech normal SVE: Dilation: 2 Effacement (%): 60 Cervical Position: Posterior Station: -2 Presentation: Vertex (confirmed with bedside US ) Exam by:: A Marylouise Mallet, CNM (Membrane sweep performed with consent)   NST: Baseline FHR: 135 beats/min Variability: moderate Accelerations: present Decelerations: absent Tocometry: Occasional mild contractions  Time: at least 20 minutes   Interpretation: Category I INDICATIONS: rule out uterine contractions RESULTS:  A NST procedure was performed with FHR monitoring and a normal baseline established, appropriate time of 20-40 minutes of evaluation, and accels >2 seen w 15x15 characteristics.  Results show a REACTIVE NST.   Consults: None  Procedures: NST  Hospital Course: The patient was admitted to Labor and Delivery Triage for observation. Early labor signs reviewed. Risk/benefits of membrane sweep were reviewed. Nguyet desired membrane sweep and tolerated procedure well. Reviewed when to come to L&D for evaluation. IOL scheduled for 06/23/2023 at 0001 for postdates pregnancy if she does not go into labor prior.  She was deemed stable for discharge and further outpatient management.   Discharge Condition: stable  Disposition: Discharge disposition: 01-Home or Self Care       Allergies as of 06/20/2023       Reactions    Ibuprofen Hives        Medication List     TAKE these medications    acetaminophen  500 MG tablet Commonly known as: TYLENOL  Take 2 tablets (1,000 mg total) by mouth every 6 (six) hours as needed (for pain scale < 4).   albuterol (2.5 MG/3ML) 0.083% nebulizer solution Commonly known as: PROVENTIL Take 2.5 mg by nebulization as needed for wheezing or shortness of breath.   diphenhydrAMINE  25 MG tablet Commonly known as: BENADRYL  Take 2 tablets (50 mg total) by mouth every 6 (six) hours as needed for itching.   ferrous sulfate  325 (65 FE) MG tablet Take 1 tablet (325 mg total) by mouth 2 (two) times daily with a meal.   multivitamin-prenatal 27-0.8 MG Tabs tablet Take 1 tablet by mouth daily at 12 noon.   sodium chloride  0.65 % Soln nasal spray Commonly known as: OCEAN Place 1 spray into both nostrils as needed for congestion.        Follow-up Information     [facility]. Go on 06/23/2023.   Specialty: Obstetrics and Gynecology Why: at 12:01 am (the midnight between Friday and Saturday) for scheduled induction of labor.  Please call L&D at [phone] on Friday at 11pm to check for bed availability. Contact information: [address] [place]  [address] [phone]               ----- Teodora Fell, CNM  Certified Nurse Midwife [place]  [facility]

## 2023-06-20 NOTE — OB Triage Note (Signed)
 Pt reports to labor and delivery with complaints of lower back 5/10. It comes and goes at an inconsistent cadence. She is feeling more pelvic pressure. Denies vaginal bleeding and LOF. States positive fetal movement.  Pt is requesting to know how dilated her cervix is and would like to schedule her induction.   EFM and toco applied and assessing.

## 2023-06-20 NOTE — Discharge Summary (Signed)
 Monique Hansen is a 23 y.o. female. She is at [redacted]w[redacted]d gestation. Patient's last menstrual period was 09/12/2022 (approximate). Estimated Date of Delivery: 06/16/23  Prenatal care site: Monique Hansen  Chief complaint: Uterine Contractions   HPI: Monique Hansen presents to L&D with concerns of uterine contractions. She reports experiencing intermittent uterine contraction since 0530 this morning (06/04). She states nothing is consistent. "I just wanted to come in this morning to see if I can be delivered, I am not in labor."   Factors complicating pregnancy: Obesity- BMI: 38.41 Uterine Contractions during pregnancy Low back pain during pregnancy in third trimester   S: Resting comfortably. Reports no contractions, no vaginal bleeding, and leakage of fluid. Reports active fetal movement.   Maternal Medical History:  Past Medical Hx:  has a past medical history of Anemia and Asthma.    Past Surgical Hx:  has a past surgical history that includes Tonsillectomy.   Allergies  Allergen Reactions   Ibuprofen Hives     Prior to Admission medications   Medication Sig Start Date End Date Taking? Authorizing Provider  acetaminophen  (TYLENOL ) 500 MG tablet Take 2 tablets (1,000 mg total) by mouth every 6 (six) hours as needed (for pain scale < 4). 01/18/21   Auston Left, CNM  albuterol (PROVENTIL) (2.5 MG/3ML) 0.083% nebulizer solution Take 2.5 mg by nebulization as needed for wheezing or shortness of breath.    [provider]  diphenhydrAMINE  (BENADRYL ) 25 MG tablet Take 2 tablets (50 mg total) by mouth every 6 (six) hours as needed for itching. 12/11/20   McVey, Georges Kings, CNM  ferrous sulfate  325 (65 FE) MG tablet Take 1 tablet (325 mg total) by mouth 2 (two) times daily with a meal. 01/18/21   Wilson, Prudy Brownie, CNM  Prenatal Vit-Fe Fumarate-FA (MULTIVITAMIN-PRENATAL) 27-0.8 MG TABS tablet Take 1 tablet by mouth daily at 12 noon.    [provider]  sodium chloride   (OCEAN) 0.65 % SOLN nasal spray Place 1 spray into both nostrils as needed for congestion. 12/11/20   McVey, Georges Kings, CNM    Social History: She  reports that she has never smoked. She has never used smokeless tobacco. She reports that she does not currently use alcohol. She reports that she does not use drugs.  Family History: family history is not on file.   Review of Systems:  Review of Systems  Constitutional: Negative.   Gastrointestinal: Negative.   Genitourinary: Negative.   Neurological: Negative.   Psychiatric/Behavioral: Negative.       O:  Ht 5\' 2"  (1.575 m)   Wt 95.3 kg   LMP 09/12/2022 (Approximate)   BMI 38.41 kg/m  No results found for this or any previous visit (from the past 48 hours).   Constitutional: NAD, AAOx3  HE/ENT: extraocular movements grossly intact CV: RRR PULM: nl respiratory effort Abd: gravid Ext: Non-tender, Nonedmeatous Psych: mood appropriate, speech normal Pelvic : Deferred  SVE: Dilation: Fingertip Effacement (%): Thick Station: Ballotable Presentation: Vertex Exam by:: [email or web address]   NST (06/20/2023) Baseline: 135 bpm Variability: moderate Accels: Present Decels: none Toco: occasional x 1 Category: I Interpretation:  INDICATIONS: rule out uterine contractions RESULTS:  A NST procedure was performed with FHR monitoring and a normal baseline established, appropriate time of 20-40 minutes of evaluation, and accels >2 seen w 15x15 characteristics.  Results show a REACTIVE NST.   Assessment: 23 y.o. [redacted]w[redacted]d here for antenatal surveillance during pregnancy.  Principle diagnosis: Uterine Contractions  Plan: Labor: Not present. US   and toco placed on abdomen. FHR tracing observed for approximately 24 minutes. Occasional x 1 contraction noted. Patient not feeling contractions and stated, "I just wanted to come in this morning to see if I can be delivered, I am not in labor."  SVE: 0.5/thick/ballotable. Patient offered to stay for  approximately 1-2 hours to assess for cervical change. Patient declined. She states she would rather go home since she has a prenatal appointment today (06/04) and 1500. Patient advised to let them know her interest for an IOL. Patient verbalized understanding.  Fetal Wellbeing: Reassuring Cat 1 tracing. Reactive NST   - D/c home stable, strict return precautions reviewed, follow-up as needed.   ----- Monique Hansen, CNM Certified Nurse Midwife [place]  [facility]

## 2023-06-20 NOTE — Discharge Instructions (Signed)
 Induction of labor for [facility] at [facility]   Induction of labor scheduled for 06/23/2023 at 12:01 am (the midnight between Friday and Saturday night).    You have been scheduled for induction of labor on 06/23/2023 at 12:0 am (midnight).  Please call Labor and Delivery at [phone] an hour before your induction time (06/22/23 at 11PM) to make sure that they have bed availability for you.  Please be patient during this time as we cannot control how many babies want to have a birthday at the same time.     This information has been sent to your prenatal provider.  They can go over common induction methods and help answer questions.  You can also call L&D with questions prior to your induction.

## 2023-06-22 ENCOUNTER — Encounter: Payer: Self-pay | Admitting: Obstetrics and Gynecology

## 2023-06-22 ENCOUNTER — Other Ambulatory Visit: Payer: Self-pay

## 2023-06-22 ENCOUNTER — Inpatient Hospital Stay: Admitting: Anesthesiology

## 2023-06-22 ENCOUNTER — Inpatient Hospital Stay
Admission: EM | Admit: 2023-06-22 | Discharge: 2023-06-23 | DRG: 807 | Disposition: A | Discharge status: Home / Self Care | Attending: Obstetrics and Gynecology | Admitting: Obstetrics and Gynecology

## 2023-06-22 DIAGNOSIS — O9081 Anemia of the puerperium: Secondary | ICD-10-CM | POA: Diagnosis present

## 2023-06-22 DIAGNOSIS — D509 Iron deficiency anemia, unspecified: Secondary | ICD-10-CM | POA: Diagnosis present

## 2023-06-22 DIAGNOSIS — O480 Post-term pregnancy: Principal | ICD-10-CM | POA: Diagnosis present

## 2023-06-22 DIAGNOSIS — O99824 Streptococcus B carrier state complicating childbirth: Secondary | ICD-10-CM | POA: Diagnosis present

## 2023-06-22 DIAGNOSIS — O99214 Obesity complicating childbirth: Secondary | ICD-10-CM | POA: Diagnosis present

## 2023-06-22 DIAGNOSIS — O479 False labor, unspecified: Secondary | ICD-10-CM | POA: Diagnosis present

## 2023-06-22 DIAGNOSIS — Z3A40 40 weeks gestation of pregnancy: Secondary | ICD-10-CM

## 2023-06-22 DIAGNOSIS — O26893 Other specified pregnancy related conditions, third trimester: Secondary | ICD-10-CM | POA: Diagnosis present

## 2023-06-22 DIAGNOSIS — Z3490 Encounter for supervision of normal pregnancy, unspecified, unspecified trimester: Secondary | ICD-10-CM | POA: Diagnosis present

## 2023-06-22 LAB — TYPE AND SCREEN
ABO/RH(D): O POS
Antibody Screen: NEGATIVE

## 2023-06-22 LAB — CBC
HCT: 29.4 % — ABNORMAL LOW (ref 36.0–46.0)
Hemoglobin: 9.7 g/dL — ABNORMAL LOW (ref 12.0–15.0)
MCH: 25.5 pg — ABNORMAL LOW (ref 26.0–34.0)
MCHC: 33 g/dL (ref 30.0–36.0)
MCV: 77.4 fL — ABNORMAL LOW (ref 80.0–100.0)
Platelets: 231 10*3/uL (ref 150–400)
RBC: 3.8 MIL/uL — ABNORMAL LOW (ref 3.87–5.11)
RDW: 14.5 % (ref 11.5–15.5)
WBC: 11.2 10*3/uL — ABNORMAL HIGH (ref 4.0–10.5)
nRBC: 0 % (ref 0.0–0.2)

## 2023-06-22 MED ORDER — PRENATAL MULTIVITAMIN CH
1.0000 | ORAL_TABLET | Freq: Every day | ORAL | Status: DC
  Administered 2023-06-23: 1 via ORAL
  Filled 2023-06-22: qty 1

## 2023-06-22 MED ORDER — OXYTOCIN BOLUS FROM INFUSION
333.0000 mL | Freq: Once | INTRAVENOUS | Status: AC
  Administered 2023-06-22: 333 mL via INTRAVENOUS

## 2023-06-22 MED ORDER — BUPIVACAINE HCL (PF) 0.25 % IJ SOLN
INTRAMUSCULAR | Status: DC | PRN
  Administered 2023-06-22: 4 mL via EPIDURAL
  Administered 2023-06-22: 3 mL via EPIDURAL

## 2023-06-22 MED ORDER — OXYCODONE HCL 5 MG PO TABS
5.0000 mg | ORAL_TABLET | ORAL | Status: DC | PRN
  Administered 2023-06-23: 5 mg via ORAL
  Filled 2023-06-22: qty 1

## 2023-06-22 MED ORDER — TERBUTALINE SULFATE 1 MG/ML IJ SOLN: 0.2500 mg | Freq: Once | INTRAMUSCULAR | Status: DC | PRN

## 2023-06-22 MED ORDER — PHENYLEPHRINE 80 MCG/ML (10ML) SYRINGE FOR IV PUSH (FOR BLOOD PRESSURE SUPPORT): 80.0000 ug | PREFILLED_SYRINGE | INTRAVENOUS | Status: DC | PRN

## 2023-06-22 MED ORDER — ONDANSETRON HCL 4 MG/2ML IJ SOLN: 4.0000 mg | INTRAMUSCULAR | Status: DC | PRN

## 2023-06-22 MED ORDER — FENTANYL-BUPIVACAINE-NACL 0.5-0.125-0.9 MG/250ML-% EP SOLN: 12.0000 mL/h | EPIDURAL | Status: DC | PRN

## 2023-06-22 MED ORDER — OXYTOCIN 10 UNIT/ML IJ SOLN
INTRAMUSCULAR | Status: AC
  Filled 2023-06-22: qty 2

## 2023-06-22 MED ORDER — MISOPROSTOL 200 MCG PO TABS
ORAL_TABLET | ORAL | Status: AC
  Filled 2023-06-22: qty 4

## 2023-06-22 MED ORDER — OXYCODONE HCL 5 MG PO TABS: 10.0000 mg | ORAL_TABLET | ORAL | Status: DC | PRN

## 2023-06-22 MED ORDER — OXYTOCIN-SODIUM CHLORIDE 30-0.9 UT/500ML-% IV SOLN: 2.5000 [IU]/h | INTRAVENOUS | Status: DC

## 2023-06-22 MED ORDER — OXYTOCIN-SODIUM CHLORIDE 30-0.9 UT/500ML-% IV SOLN
1.0000 m[IU]/min | INTRAVENOUS | Status: DC
  Administered 2023-06-22: 2 m[IU]/min via INTRAVENOUS
  Filled 2023-06-22: qty 500

## 2023-06-22 MED ORDER — OXYCODONE-ACETAMINOPHEN 5-325 MG PO TABS: 1.0000 | ORAL_TABLET | ORAL | Status: DC | PRN

## 2023-06-22 MED ORDER — DIBUCAINE (PERIANAL) 1 % EX OINT
1.0000 | TOPICAL_OINTMENT | CUTANEOUS | Status: DC | PRN
  Administered 2023-06-22: 1 via RECTAL
  Filled 2023-06-22: qty 28

## 2023-06-22 MED ORDER — WITCH HAZEL-GLYCERIN EX PADS
1.0000 | MEDICATED_PAD | CUTANEOUS | Status: DC | PRN
  Administered 2023-06-22: 1 via TOPICAL
  Filled 2023-06-22: qty 100

## 2023-06-22 MED ORDER — PENICILLIN G POT IN DEXTROSE 60000 UNIT/ML IV SOLN
3.0000 10*6.[IU] | INTRAVENOUS | Status: DC
  Administered 2023-06-22: 3 10*6.[IU] via INTRAVENOUS
  Filled 2023-06-22: qty 50

## 2023-06-22 MED ORDER — SOD CITRATE-CITRIC ACID 500-334 MG/5ML PO SOLN: 30.0000 mL | ORAL | Status: DC | PRN

## 2023-06-22 MED ORDER — LACTATED RINGERS IV SOLN: INTRAVENOUS | Status: DC

## 2023-06-22 MED ORDER — ACETAMINOPHEN 325 MG PO TABS: 650.0000 mg | ORAL_TABLET | ORAL | Status: DC | PRN

## 2023-06-22 MED ORDER — ONDANSETRON HCL 4 MG/2ML IJ SOLN: 4.0000 mg | Freq: Four times a day (QID) | INTRAMUSCULAR | Status: DC | PRN

## 2023-06-22 MED ORDER — LACTATED RINGERS IV SOLN: 500.0000 mL | Freq: Once | INTRAVENOUS | Status: DC

## 2023-06-22 MED ORDER — SODIUM CHLORIDE 0.9 % IV SOLN
INTRAVENOUS | Status: DC | PRN
  Administered 2023-06-22: 12 mL/h via EPIDURAL

## 2023-06-22 MED ORDER — FERROUS SULFATE 325 (65 FE) MG PO TABS
325.0000 mg | ORAL_TABLET | Freq: Two times a day (BID) | ORAL | Status: DC
  Administered 2023-06-23: 325 mg via ORAL
  Filled 2023-06-22: qty 1

## 2023-06-22 MED ORDER — SODIUM CHLORIDE 0.9 % IV SOLN
5.0000 10*6.[IU] | Freq: Once | INTRAVENOUS | Status: AC
  Administered 2023-06-22: 5 10*6.[IU] via INTRAVENOUS
  Filled 2023-06-22: qty 5

## 2023-06-22 MED ORDER — LACTATED RINGERS IV SOLN
500.0000 mL | INTRAVENOUS | Status: DC | PRN
  Administered 2023-06-22: 500 mL via INTRAVENOUS

## 2023-06-22 MED ORDER — FENTANYL CITRATE (PF) 100 MCG/2ML IJ SOLN: 50.0000 ug | INTRAMUSCULAR | Status: DC | PRN

## 2023-06-22 MED ORDER — COCONUT OIL OIL: 1.0000 | TOPICAL_OIL | Status: DC | PRN

## 2023-06-22 MED ORDER — ONDANSETRON HCL 4 MG PO TABS: 4.0000 mg | ORAL_TABLET | ORAL | Status: DC | PRN

## 2023-06-22 MED ORDER — OXYCODONE-ACETAMINOPHEN 5-325 MG PO TABS: 2.0000 | ORAL_TABLET | ORAL | Status: DC | PRN

## 2023-06-22 MED ORDER — TETANUS-DIPHTH-ACELL PERTUSSIS 5-2.5-18.5 LF-MCG/0.5 IM SUSY: 0.5000 mL | PREFILLED_SYRINGE | Freq: Once | INTRAMUSCULAR | Status: DC

## 2023-06-22 MED ORDER — LIDOCAINE HCL (PF) 1 % IJ SOLN
INTRAMUSCULAR | Status: AC
  Filled 2023-06-22: qty 30

## 2023-06-22 MED ORDER — SIMETHICONE 80 MG PO CHEW: 80.0000 mg | CHEWABLE_TABLET | ORAL | Status: DC | PRN

## 2023-06-22 MED ORDER — SENNOSIDES-DOCUSATE SODIUM 8.6-50 MG PO TABS
2.0000 | ORAL_TABLET | Freq: Every day | ORAL | Status: DC
  Administered 2023-06-23: 2 via ORAL
  Filled 2023-06-22: qty 2

## 2023-06-22 MED ORDER — EPHEDRINE 5 MG/ML INJ: 10.0000 mg | INTRAVENOUS | Status: DC | PRN

## 2023-06-22 MED ORDER — ACETAMINOPHEN 325 MG PO TABS
650.0000 mg | ORAL_TABLET | ORAL | Status: DC | PRN
  Administered 2023-06-22 – 2023-06-23 (×5): 650 mg via ORAL
  Filled 2023-06-22 (×5): qty 2

## 2023-06-22 MED ORDER — LIDOCAINE HCL (PF) 1 % IJ SOLN
INTRAMUSCULAR | Status: DC | PRN
  Administered 2023-06-22: 3 mL

## 2023-06-22 MED ORDER — BENZOCAINE-MENTHOL 20-0.5 % EX AERO
1.0000 | INHALATION_SPRAY | CUTANEOUS | Status: DC | PRN
  Administered 2023-06-22: 1 via TOPICAL
  Filled 2023-06-22: qty 56

## 2023-06-22 MED ORDER — DIPHENHYDRAMINE HCL 25 MG PO CAPS: 25.0000 mg | ORAL_CAPSULE | Freq: Four times a day (QID) | ORAL | Status: DC | PRN

## 2023-06-22 MED ORDER — FENTANYL-BUPIVACAINE-NACL 0.5-0.125-0.9 MG/250ML-% EP SOLN
EPIDURAL | Status: AC
  Filled 2023-06-22: qty 250

## 2023-06-22 MED ORDER — DIPHENHYDRAMINE HCL 50 MG/ML IJ SOLN: 12.5000 mg | INTRAMUSCULAR | Status: DC | PRN

## 2023-06-22 MED ORDER — AMMONIA AROMATIC IN INHA
RESPIRATORY_TRACT | Status: AC
  Filled 2023-06-22: qty 10

## 2023-06-22 MED ORDER — CALCIUM CARBONATE ANTACID 500 MG PO CHEW: 2.0000 | CHEWABLE_TABLET | ORAL | Status: DC | PRN

## 2023-06-22 MED ORDER — LIDOCAINE HCL (PF) 1 % IJ SOLN: 30.0000 mL | INTRAMUSCULAR | Status: DC | PRN

## 2023-06-22 NOTE — Anesthesia Procedure Notes (Addendum)
 Epidural Patient location during procedure: OB Start time: 06/22/2023 1:15 PM End time: 06/22/2023 1:17 PM  Staffing Anesthesiologist: Dorothey Gate, MD Resident/CRNA: Philippe Brazen, CRNA Performed: resident/CRNA   Preanesthetic Checklist Completed: patient identified, IV checked, site marked, risks and benefits discussed, surgical consent, monitors and equipment checked and pre-op evaluation  Epidural Patient position: sitting Prep: ChloraPrep Patient monitoring: heart rate, continuous pulse ox and blood pressure Approach: midline Location: L3-L4 Injection technique: LOR saline and LOR air  Needle:  Needle type: Tuohy  Needle gauge: 17 G Needle length: 9 cm and 9 Catheter type: closed end flexible Catheter size: 19 Gauge Test dose: negative and 1.5% lidocaine  with Epi 1:200 K  Assessment Events: blood not aspirated, no cerebrospinal fluid, injection not painful, no injection resistance, no paresthesia and negative IV test  Additional Notes 2 attempts by C. Sabra Cramp, CRNA. 2 attempts by S. Darlen Eglin, Scientist, clinical (histocompatibility and immunogenetics). False loss on first attempt and successful placement second attempt. Pt. Evaluated and documentation done after procedure finished. Patient identified. Risks/Benefits/Options discussed with patient including but not limited to bleeding, infection, nerve damage, paralysis, failed block, incomplete pain control, headache, blood pressure changes, nausea, vomiting, reactions to medication both or allergic, itching and postpartum back pain. Confirmed with bedside nurse the patient's most recent platelet count. Confirmed with patient that they are not currently taking any anticoagulation, have any bleeding history or any family history of bleeding disorders. Patient expressed understanding and wished to proceed. All questions were answered. Sterile technique was used throughout the entire procedure. Please see nursing notes for vital signs. Test dose was given through epidural catheter and  negative prior to continuing to dose epidural or start infusion. Warning signs of high block given to the patient including shortness of breath, tingling/numbness in hands, complete motor block, or any concerning symptoms with instructions to call for help. Patient was given instructions on fall risk and not to get out of bed. All questions and concerns addressed with instructions to call with any issues or inadequate analgesia.    Patient tolerated the insertion well without immediate complications.Reason for block:procedure for pain

## 2023-06-22 NOTE — Discharge Summary (Signed)
 Postpartum Discharge Summary  Patient Name: Monique Hansen DOB: 2000/07/26 MRN: [id number]  Date of admission: 06/22/2023 Delivery date:06/22/2023 Delivering provider: Collin Hansen Date of discharge: 06/23/2023  Primary OB: Monique Hansen KGM:[id number]'D last menstrual period was 09/12/2022 (approximate). EDC Estimated Date of Delivery: 06/16/23 Gestational Age at Delivery: [redacted]w[redacted]d   Admitting diagnosis: Uterine contractions [O47.9] Encounter for induction of labor [Z34.90] Intrauterine pregnancy: [redacted]w[redacted]d     Secondary diagnosis:   Principal Problem:   NSVD (normal spontaneous vaginal delivery) Active Problems:   Post-dates pregnancy   Encounter for induction of labor   Postpartum anemia   Discharge Diagnosis: Term Pregnancy Delivered      Hospital course: Onset of Labor With Vaginal Delivery      23 y.o. yo [id number] at [redacted]w[redacted]d was admitted in Latent Labor that was augmented on 06/22/2023. Labor course was complicated by none.  Membrane Rupture Time/Date: 2:40 PM,06/22/2023  Delivery Method:Vaginal, Spontaneous Operative Delivery:N/A Episiotomy: None Lacerations:  Labial Patient had a postpartum course complicated by postpartum anemia. She received IV iron transfusion with venofer prior to discharge.  She is ambulating, tolerating a regular diet, passing flatus, and urinating well. Patient is discharged home in stable condition on 06/23/23.  Newborn Data: Birth date:06/22/2023 Birth time:4:00 PM Gender:Female Living status:Living Apgars:8 ,9  Weight:3420 g                                            Post partum procedures:IV iron transfusion with Venofer Augmentation:: AROM and Pitocin  Complications: None Delivery Type: spontaneous vaginal delivery Anesthesia: epidural anesthesia Placenta: spontaneous To Pathology: No   Prenatal Labs:  Blood type/Rh O pos  Antibody screen neg  Rubella Immune  Varicella Immune  RPR NR  HBsAg Neg  HIV NR  GC neg  Chlamydia neg  Genetic  screening    1 hour GTT 94  3 hour GTT    GBS Pos    Magnesium Sulfate received: No BMZ received: No Rhophylac:was not indicated MMR: was not indicated Varivax vaccine given: was not indicated T-DaP:declined  Flu: given 01/11/23  Transfusion:No  Physical exam  Vitals:   06/22/23 2330 06/23/23 0330 06/23/23 0720 06/23/23 1111  BP: 121/72 123/80 133/75 135/73  Pulse: 84 74 72 88  Resp: 18 18 18 18   Temp: 98.3 F (36.8 C) 98.9 F (37.2 C) 97.8 F (36.6 C) 98.4 F (36.9 C)  TempSrc: Oral Oral Oral Oral  SpO2: 99% 100% 100% 100%  Weight:      Height:       General: alert, cooperative, and no distress Lochia: appropriate Uterine Fundus: firm Perineum: minimal edema/repair well approximated DVT Evaluation: No evidence of DVT seen on physical exam.  Labs: Lab Results  Component Value Date   WBC 12.5 (H) 06/23/2023   HGB 8.2 (L) 06/23/2023   HCT 25.6 (L) 06/23/2023   MCV 77.8 (L) 06/23/2023   PLT 222 06/23/2023      Latest Ref Rng & Units 12/11/2020    6:30 AM  CMP  Glucose 70 - 99 mg/dL 742   BUN 6 - 20 mg/dL 10   Creatinine 5.95 - 1.00 mg/dL 6.38   Sodium 756 - 433 mmol/L 134   Potassium 3.5 - 5.1 mmol/L 3.5   Chloride 98 - 111 mmol/L 104   CO2 22 - 32 mmol/L 20   Calcium  8.9 - 10.3 mg/dL 8.8   Total  Protein 6.5 - 8.1 g/dL 7.6   Total Bilirubin 0.3 - 1.2 mg/dL 0.3   Alkaline Phos 38 - 126 U/L 182   AST 15 - 41 U/L 20   ALT 0 - 44 U/L 16    Edinburgh Score:    06/22/2023    7:30 PM  Edinburgh Postnatal Depression Scale Screening Tool  I have been able to laugh and see the funny side of things. 0  I have looked forward with enjoyment to things. 0  I have blamed myself unnecessarily when things went wrong. 0  I have been anxious or worried for no good reason. 0  I have felt scared or panicky for no good reason. 0  Things have been getting on top of me. 0  I have been so unhappy that I have had difficulty sleeping. 0  I have felt sad or miserable. 0  I  have been so unhappy that I have been crying. 0  The thought of harming myself has occurred to me. 0  Edinburgh Postnatal Depression Scale Total 0    Risk assessment for postpartum VTE and prophylactic treatment: Very high risk factors: None High risk factors: BMI 40-50 kg/m2 Moderate risk factors: None  Postpartum VTE prophylaxis with LMWH not indicated  After visit meds:  Allergies as of 06/23/2023       Reactions   Ibuprofen Hives   Shellfish Allergy Hives        Medication List     TAKE these medications    acetaminophen  500 MG tablet Commonly known as: TYLENOL  Take 2 tablets (1,000 mg total) by mouth every 6 (six) hours as needed (for pain scale < 4).   albuterol (2.5 MG/3ML) 0.083% nebulizer solution Commonly known as: PROVENTIL Take 2.5 mg by nebulization as needed for wheezing or shortness of breath.   diphenhydrAMINE  25 MG tablet Commonly known as: BENADRYL  Take 2 tablets (50 mg total) by mouth every 6 (six) hours as needed for itching.   ferrous sulfate  325 (65 FE) MG tablet Take 1 tablet (325 mg total) by mouth 2 (two) times daily with a meal.   multivitamin-prenatal 27-0.8 MG Tabs tablet Take 1 tablet by mouth daily at 12 noon.   sodium chloride  0.65 % Soln nasal spray Commonly known as: OCEAN Place 1 spray into both nostrils as needed for congestion.       Discharge home in stable condition Infant Feeding: Bottle Infant Disposition:home with mother Discharge instruction: per After Visit Summary and Postpartum booklet. Activity: Advance as tolerated. Pelvic rest for 6 weeks.  Diet: routine diet Anticipated Birth Control: Nexplanon Postpartum Appointment:6 weeks Additional Postpartum F/U: none Future Appointments:No future appointments. Follow up Visit:  Follow-up Information     Center, [facility]. Schedule an appointment as soon as possible for a visit in 6 week(s).   Specialty: General Practice Why: for a postpartum  visit Contact information: [address] [place] [address] [phone]                 Plan:  Monique Hansen was discharged to home in good condition. Follow-up appointment as directed.    Signed:  Fraser Hansen, CNM Certified Nurse Midwife [facility]  Clinic OB[facility]N [facility]

## 2023-06-22 NOTE — OB Triage Note (Addendum)
 Monique Hansen 23 y.o. @[redacted]w[redacted]d  G2P1  presents to Labor & Delivery triage via wheelchair steered by ED staff reporting contractions every 6-7 mins rating 10/10 pain. Pt reports feeling ctx in lower back and lower abdomen. She denies signs and symptoms consistent with rupture of membranes or active vaginal bleeding. She has noted some blood due to her membrane sweep. She states positive fetal movement. External FM and TOCO applied to non-tender abdomen. Initial FHR 140. SVE 3/70/-2. Foul smelling discharge noted on vaginal exam. Vital signs obtained and within normal limits. Patient oriented to care environment including call bell and bed control use. Wilson, CNM notified of patient's arrival. Plan to labor eval. Reactive NST then monitors can be removed until 0730 per Elvan Hamel, CNM. Monique Galentine L. Donnette Gal, RN BSN 06/22/2023 5:47 AM

## 2023-06-22 NOTE — Anesthesia Preprocedure Evaluation (Signed)
 Anesthesia Evaluation  Patient identified by MRN, date of birth, ID band Patient awake    Reviewed: Allergy & Precautions, H&P , NPO status , Patient's Chart, lab work & pertinent test results  Airway Mallampati: II       Dental no notable dental hx.    Pulmonary asthma    Pulmonary exam normal        Cardiovascular negative cardio ROS Normal cardiovascular exam     Neuro/Psych negative neurological ROS  negative psych ROS   GI/Hepatic negative GI ROS, Neg liver ROS,,,  Endo/Other  negative endocrine ROS    Renal/GU negative Renal ROS  negative genitourinary   Musculoskeletal   Abdominal   Peds  Hematology  (+) Blood dyscrasia, anemia   Anesthesia Other Findings   Reproductive/Obstetrics (+) Pregnancy                             Anesthesia Physical Anesthesia Plan  ASA: 2  Anesthesia Plan: Epidural   Post-op Pain Management:    Induction:   PONV Risk Score and Plan:   Airway Management Planned:   Additional Equipment:   Intra-op Plan:   Post-operative Plan:   Informed Consent:   Plan Discussed with: Anesthesiologist and CRNA  Anesthesia Plan Comments:        Anesthesia Quick Evaluation

## 2023-06-22 NOTE — H&P (Signed)
 OB History & Physical   History of Present Illness:  Chief Complaint:   HPI:  Monique Hansen Monique Hansen is a 23 y.o. G75P1001 female at [redacted]w[redacted]d dated by u/s.  She presents to L&D for labor.  She reports:  -active fetal movement -no leakage of fluid -no vaginal bleeding -onset of contractions this am   Pregnancy Issues: 1. Obesity   Maternal Medical History:   Past Medical History:  Diagnosis Date   Anemia    Asthma    Pregnancy Induced    Past Surgical History:  Procedure Laterality Date   TONSILLECTOMY     Approx 23 years old    Allergies  Allergen Reactions   Ibuprofen Hives   Shellfish Allergy Hives    Prior to Admission medications   Medication Sig Start Date End Date Taking? Authorizing Provider  ferrous sulfate  325 (65 FE) MG tablet Take 1 tablet (325 mg total) by mouth 2 (two) times daily with a meal. 01/18/21  Yes Wilson, Prudy Brownie, CNM  Prenatal Vit-Fe Fumarate-FA (MULTIVITAMIN-PRENATAL) 27-0.8 MG TABS tablet Take 1 tablet by mouth daily at 12 noon.   Yes [provider]  acetaminophen  (TYLENOL ) 500 MG tablet Take 2 tablets (1,000 mg total) by mouth every 6 (six) hours as needed (for pain scale < 4). 01/18/21   Auston Left, CNM  albuterol (PROVENTIL) (2.5 MG/3ML) 0.083% nebulizer solution Take 2.5 mg by nebulization as needed for wheezing or shortness of breath.    [provider]  diphenhydrAMINE  (BENADRYL ) 25 MG tablet Take 2 tablets (50 mg total) by mouth every 6 (six) hours as needed for itching. 12/11/20   McVey, Georges Kings, CNM  sodium chloride  (OCEAN) 0.65 % SOLN nasal spray Place 1 spray into both nostrils as needed for congestion. 12/11/20   McVey, Georges Kings, CNM     Prenatal care site: [facility]  Social History: She  reports that she has never smoked. She has never used smokeless tobacco. She reports that she does not currently use alcohol. She reports that she does not use drugs.  Family History: family history is  not on file.   Review of Systems: A full review of systems was performed and negative except as noted in the HPI.    Physical Exam:  Vital Signs: BP 131/66   Pulse 92   Temp 98 F (36.7 C) (Oral)   Resp 16   Ht 5\' 1"  (1.549 m)   Wt 98.4 kg   LMP 09/12/2022 (Approximate)   BMI 41.00 kg/m   General:   alert and cooperative  Skin:  normal  Neurologic:    Alert & oriented x 3  Lungs:   Nl effort  Heart:   regular rate and rhythm  Abdomen:  soft, non-tender; bowel sounds normal; no masses,  no organomegaly  Extremities: : non-tender, symmetric, no edema bilaterally.      Pertinent Results:  Prenatal Labs: Blood type/Rh O pos  Antibody screen neg  Rubella Immune  Varicella Immune  RPR NR  HBsAg Neg  HIV NR  GC neg  Chlamydia neg  Genetic screening   1 hour GTT 94  3 hour GTT   GBS Pos   FHT: FHR: 125 bpm, variability: moderate,  accelerations:  Present,  decelerations:  Present Earlies Category/reactivity:  Category I TOCO: regular, every 6-8 minutes SVE: Dilation: 3 / Effacement (%): 50 / Station: -3     US  OB Follow Up Result Date: 06/10/2023 CLINICAL DATA:  Complete fetal anatomic survey  EXAM: OBSTETRIC 14+ WK ULTRASOUND FOLLOW-UP FINDINGS: Number of Fetuses: 1 Heart Rate:  147 bpm Movement: Yes Presentation: Cephalic Previa: No Placental Location: Fundal Amniotic Fluid (Subjective): Normal Amniotic Fluid (Objective): Vertical pocket 5.6cm AFI 15.5 cm (5%ile= 7.5 cm, 95%= 24.4 cm for 37 wks) FETAL BIOMETRY BPD:  9.3cm 38w 0d HC:    34.4cm 39w 5d AC:    35.1cm 39w 0d FL:    7.2cm 36w 6d Current Mean GA: 37w 5d US  EDC: 06/16/2023 Assigned GA: 37w 5d Assigned EDC: 06/16/2023 Estimated Fetal Weight:  3508.7g 79.4%ile FETAL ANATOMY Lateral Ventricles: Appears normal Thalami/CSP: Appears normal Posterior Fossa:  Not visualized Nuchal Region: Not visualized   NFT= N/A > 20 WKS Upper Lip: Appears normal Spine: Appears normal 4 Chamber Heart on Left: Appears normal LVOT: Appears  normal RVOT: Appears normal Stomach on Left: Appears normal 3 Vessel Cord: Appears normal Cord Insertion site: Previously seen Kidneys: Appears normal Bladder: Appears normal Extremities: Previously seen Sex: Previously Seen Technical Limitations: Fetal position, Vance gestational age Maternal Findings: Cervix:  3.6 cm IMPRESSION: 1. Single live intrauterine pregnancy as above, estimated age 37 weeks and 5 days. 2. Persistent suboptimal visualization of the posterior fossa and nuchal region. The upper lip, LVOT, RVOT, and kidneys are well visualized on this exam, and were not seen previously. In Electronically Signed   By: Bobbye Burrow M.D.   On: 06/10/2023 20:42     Assessment:  Monique Hansen is a 23 y.o. G2P1001 female at [redacted]w[redacted]d with postdates.   Plan:  1. Admit to Labor & Delivery; consents reviewed and obtained  2. Fetal Well being  - Fetal Tracing: Cat I - GBS pos - Presentation: vtx confirmed by sve   3. Routine OB: - Prenatal labs reviewed, as above - Rh pos - CBC & T&S on admit - Clear fluids, IVF  4. Augmentation  -  Contractions by external toco in place -  Pelvis proven to 3080g -  Plan for augmentation with pitocin  -  Plan for continuous fetal monitoring  -  Maternal pain control as desired: IVPM, nitrous, regional anesthesia - Anticipate vaginal delivery  5. Post Partum Planning: - Infant feeding: Bottle - Contraception: TBD - Tdap: last dose 11/19/20 - Flu: given 01/11/23 - RSV: out of season  Collin Deal, CNM 06/22/2023 10:32 AM

## 2023-06-22 NOTE — Progress Notes (Signed)
 Labor Progress Note  Monique Hansen is a 23 y.o. G2P1001 at [redacted]w[redacted]d by ultrasound admitted for labor augmentation   Subjective: Pt reports the epidural is starting to work well  Objective: BP 126/63   Pulse 88   Temp 98.2 F (36.8 C) (Oral)   Resp 18   Ht 5\' 1"  (1.549 m)   Wt 98.4 kg   LMP 09/12/2022 (Approximate)   SpO2 98%   BMI 41.00 kg/m   Fetal Assessment: FHT:  FHR: 130 bpm, variability: moderate,  accelerations:  Present,  decelerations:  Absent Category/reactivity:  Category I UC:   regular, every 3-5 minutes SVE:    Dilation: 6cm  Effacement: 80%  Station:  -1  Consistency: soft  Position: anterior  Membrane status: AROM  Amniotic color: Light mec  Labs: Lab Results  Component Value Date   WBC 11.2 (H) 06/22/2023   HGB 9.7 (L) 06/22/2023   HCT 29.4 (L) 06/22/2023   MCV 77.4 (L) 06/22/2023   PLT 231 06/22/2023    Assessment / Plan: Augmentation of labor, progressing well 1003 3/80/-3 1012 Pitocin  started 1422 4/90/-3 1449 AROM Pitocin  at 8mU  Labor: Progressing normally Preeclampsia:  126/63 Fetal Wellbeing:  Category I Pain Control:  Epidural I/D:  Afebrile, GBS neg, AROM Anticipated MOD:  NSVD  Aron Lard, CNM 06/22/2023, 2:44 PM

## 2023-06-23 ENCOUNTER — Encounter: Payer: Self-pay | Admitting: Obstetrics and Gynecology

## 2023-06-23 DIAGNOSIS — O9081 Anemia of the puerperium: Secondary | ICD-10-CM | POA: Diagnosis present

## 2023-06-23 LAB — CBC
HCT: 25.6 % — ABNORMAL LOW (ref 36.0–46.0)
Hemoglobin: 8.2 g/dL — ABNORMAL LOW (ref 12.0–15.0)
MCH: 24.9 pg — ABNORMAL LOW (ref 26.0–34.0)
MCHC: 32 g/dL (ref 30.0–36.0)
MCV: 77.8 fL — ABNORMAL LOW (ref 80.0–100.0)
Platelets: 222 10*3/uL (ref 150–400)
RBC: 3.29 MIL/uL — ABNORMAL LOW (ref 3.87–5.11)
RDW: 14.6 % (ref 11.5–15.5)
WBC: 12.5 10*3/uL — ABNORMAL HIGH (ref 4.0–10.5)
nRBC: 0 % (ref 0.0–0.2)

## 2023-06-23 LAB — RPR: RPR Ser Ql: NONREACTIVE

## 2023-06-23 MED ORDER — ALBUTEROL SULFATE (2.5 MG/3ML) 0.083% IN NEBU: 2.5000 mg | INHALATION_SOLUTION | Freq: Once | RESPIRATORY_TRACT | Status: DC | PRN

## 2023-06-23 MED ORDER — METHYLPREDNISOLONE SODIUM SUCC 125 MG IJ SOLR: 125.0000 mg | Freq: Once | INTRAMUSCULAR | Status: DC | PRN

## 2023-06-23 MED ORDER — IRON SUCROSE 500 MG IVPB - SIMPLE MED
500.0000 mg | Freq: Once | INTRAVENOUS | Status: AC
  Administered 2023-06-23: 500 mg via INTRAVENOUS
  Filled 2023-06-23: qty 500

## 2023-06-23 MED ORDER — EPINEPHRINE 0.3 MG/0.3ML IJ SOAJ: 0.3000 mg | Freq: Once | INTRAMUSCULAR | Status: DC | PRN

## 2023-06-23 MED ORDER — DIPHENHYDRAMINE HCL 50 MG/ML IJ SOLN: 25.0000 mg | Freq: Once | INTRAMUSCULAR | Status: DC | PRN

## 2023-06-23 MED ORDER — SODIUM CHLORIDE 0.9 % IV SOLN: INTRAVENOUS | Status: DC | PRN

## 2023-06-23 MED ORDER — SODIUM CHLORIDE 0.9 % IV BOLUS: 500.0000 mL | Freq: Once | INTRAVENOUS | Status: DC | PRN

## 2023-06-23 NOTE — Anesthesia Postprocedure Evaluation (Signed)
 Anesthesia Post Note  Patient: Alea Ryer  Procedure(s) Performed: AN AD HOC LABOR EPIDURAL  Patient location during evaluation: Mother Baby Anesthesia Type: Epidural Level of consciousness: awake and alert Pain management: pain level controlled Vital Signs Assessment: post-procedure vital signs reviewed and stable Respiratory status: spontaneous breathing, nonlabored ventilation and respiratory function stable Cardiovascular status: stable Postop Assessment: no headache, no backache and epidural receding Anesthetic complications: no   No notable events documented.   Last Vitals:  Vitals:   06/23/23 0330 06/23/23 0720  BP: 123/80 133/75  Pulse: 74 72  Resp: 18 18  Temp: 37.2 C 36.6 C  SpO2: 100% 100%    Last Pain:  Vitals:   06/23/23 0720  TempSrc: Oral  PainSc:                  Baltazar Bonier

## 2023-06-23 NOTE — Progress Notes (Signed)
 An After Visit Summary was printed and given to the patient. RN reviewed f/u apt, reasons to seek medical attention, breast/ peri care, medication use, PPD/ baby blues, and answered all questions

## 2023-06-23 NOTE — Discharge Instructions (Signed)
 Vaginal Delivery, Care After Refer to this sheet in the next few weeks. These discharge instructions provide you with information on caring for yourself after delivery. Your caregiver may also give you specific instructions. Your treatment has been planned according to the most current medical practices available, but problems sometimes occur. Call your caregiver if you have any problems or questions after you go home. HOME CARE INSTRUCTIONS Take over-the-counter or prescription medicines only as directed by your caregiver or pharmacist. Do not drink alcohol, especially if you are breastfeeding or taking medicine to relieve pain. Do not smoke tobacco. Continue to use good perineal care. Good perineal care includes: Wiping your perineum from back to front Keeping your perineum clean. You can do sitz baths twice a day, to help keep this area clean Do not use tampons, douche or have sex until your caregiver says it is okay. Shower only and avoid sitting in submerged water, aside from sitz baths Wear a well-fitting bra that provides breast support. Eat healthy foods. Drink enough fluids to keep your urine clear or pale yellow. Eat high-fiber foods such as whole grain cereals and breads, brown rice, beans, and fresh fruits and vegetables every day. These foods may help prevent or relieve constipation. Avoid constipation with high fiber foods or medications, such as miralax or metamucil Follow your caregiver's recommendations regarding resumption of activities such as climbing stairs, driving, lifting, exercising, or traveling. Talk to your caregiver about resuming sexual activities. Resumption of sexual activities is dependent upon your risk of infection, your rate of healing, and your comfort and desire to resume sexual activity. Try to have someone help you with your household activities and your newborn for at least a few days after you leave the hospital. Rest as much as possible. Try to rest or  take a nap when your newborn is sleeping. Increase your activities gradually. Keep all of your scheduled postpartum appointments. It is very important to keep your scheduled follow-up appointments. At these appointments, your caregiver will be checking to make sure that you are healing physically and emotionally. SEEK MEDICAL CARE IF:  You are passing large clots from your vagina. Save any clots to show your caregiver. You have a foul smelling discharge from your vagina. You have trouble urinating. You are urinating frequently. You have pain when you urinate. You have a change in your bowel movements. You have increasing redness, pain, or swelling near your vaginal incision (episiotomy) or vaginal tear. You have pus draining from your episiotomy or vaginal tear. Your episiotomy or vaginal tear is separating. You have painful, hard, or reddened breasts. You have a severe headache. You have blurred vision or see spots. You feel sad or depressed. You have thoughts of hurting yourself or your newborn. You have questions about your care, the care of your newborn, or medicines. You are dizzy or light-headed. You have a rash. You have nausea or vomiting. You were breastfeeding and have not had a menstrual period within 12 weeks after you stopped breastfeeding. You are not breastfeeding and have not had a menstrual period by the 12th week after delivery. You have a fever. SEEK IMMEDIATE MEDICAL CARE IF:  You have persistent pain. You have chest pain. You have shortness of breath. You faint. You have leg pain. You have stomach pain. Your vaginal bleeding saturates two or more sanitary pads in 1 hour. MAKE SURE YOU:  Understand these instructions. Will watch your condition. Will get help right away if you are not doing well or  get worse. Document Released: 12/31/1999 Document Revised: 05/19/2013 Document Reviewed: 08/30/2011 [facility] Patient Information 2015 [place], [place]. This  information is not intended to replace advice given to you by your health care provider. Make sure you discuss any questions you have with your health care provider.  Sitz Bath A sitz bath is a warm water bath taken in the sitting position. The water covers only the hips and butt (buttocks). We recommend using one that fits in the toilet, to help with ease of use and cleanliness. It may be used for either healing or cleaning purposes. Sitz baths are also used to relieve pain, itching, or muscle tightening (spasms). The water may contain medicine. Moist heat will help you heal and relax.  HOME CARE  Take 3 to 4 sitz baths a day. Fill the bathtub half-full with warm water. Sit in the water and open the drain a little. Turn on the warm water to keep the tub half-full. Keep the water running constantly. Soak in the water for 15 to 20 minutes. After the sitz bath, pat the affected area dry. GET HELP RIGHT AWAY IF: You get worse instead of better. Stop the sitz baths if you get worse. MAKE SURE YOU: Understand these instructions. Will watch your condition. Will get help right away if you are not doing well or get worse. Document Released: 02/10/2004 Document Revised: 09/27/2011 Document Reviewed: 05/02/2010 [facility] Patient Information 2015 [place], [place]. This information is not intended to replace advice given to you by your health care provider. Make sure you discuss any questions you have with your health care provider.

## 2023-06-23 NOTE — Progress Notes (Signed)
 Postpartum Day  1  Subjective: 23 y.o. G2P1001 postpartum day #1 status post normal spontaneous vaginal delivery. She is ambulating, is tolerating po, is voiding spontaneously.  Her pain is well controlled on PO pain medications. Her lochia is less than menses.  Objective: BP 135/73 (BP Location: Right Arm)   Pulse 88   Temp 98.4 F (36.9 C) (Oral)   Resp 18   Ht 5\' 1"  (1.549 m)   Wt 98.4 kg   LMP 09/12/2022 (Approximate)   SpO2 100%   BMI 41.00 kg/m    Physical Exam:  General: alert, cooperative, and no distress Breasts: soft/nontender Pulm: nl effort Abdomen: soft, non-tender, active bowel sounds Uterine Fundus: firm Perineum: minimal edema, repair well approximated Lochia: appropriate DVT Evaluation: No evidence of DVT seen on physical exam.  Recent Labs    06/22/23 1045 06/23/23 0953  HGB 9.7* 8.2*  HCT 29.4* 25.6*  WBC 11.2* 12.5*  PLT 231 222    Assessment/Plan: 23 y.o. G2P1001 postpartum day # 1  1. Continue routine postpartum care  2. Infant feeding status: formula feeding -Encouraged snug fitting bra, cold application, Tylenol  PRN, and cabbage leaves for engorgement for formula feeding   3. Contraception plan: TBD  4. Postpartum anemia - clinically significant.  -Hemodynamically stable and asymptomatic -Intervention: start on oral supplementation with ferrous sulfate  325 mg  and IV iron transfusion with venofer ordered   5. Immunization status:   all immunizations up to date  Disposition: continue inpatient postpartum care , desires discharge home today    LOS: 1 day   Teodora Fell, CNM 06/23/2023, 11:52 AM   ----- Fraser Jackson  Certified Nurse Midwife [facility]

## 2023-08-02 ENCOUNTER — Encounter: Payer: Self-pay | Admitting: Emergency Medicine

## 2023-08-02 ENCOUNTER — Other Ambulatory Visit: Payer: Self-pay

## 2023-08-02 ENCOUNTER — Emergency Department
Admission: EM | Admit: 2023-08-02 | Discharge: 2023-08-02 | Disposition: A | Discharge status: Home / Self Care | Attending: Emergency Medicine | Admitting: Emergency Medicine

## 2023-08-02 DIAGNOSIS — H60311 Diffuse otitis externa, right ear: Secondary | ICD-10-CM | POA: Diagnosis not present

## 2023-08-02 DIAGNOSIS — H60312 Diffuse otitis externa, left ear: Secondary | ICD-10-CM | POA: Insufficient documentation

## 2023-08-02 DIAGNOSIS — H9201 Otalgia, right ear: Secondary | ICD-10-CM | POA: Diagnosis present

## 2023-08-02 MED ORDER — ACETAMINOPHEN 325 MG PO TABS
650.0000 mg | ORAL_TABLET | Freq: Once | ORAL | Status: DC
  Filled 2023-08-02: qty 2

## 2023-08-02 MED ORDER — ACETAMINOPHEN 500 MG PO TABS
1000.0000 mg | ORAL_TABLET | Freq: Once | ORAL | Status: AC
  Administered 2023-08-02: 1000 mg via ORAL
  Filled 2023-08-02: qty 2

## 2023-08-02 MED ORDER — OFLOXACIN 0.3 % OP SOLN
5.0000 [drp] | Freq: Every day | OPHTHALMIC | Status: DC
  Administered 2023-08-02: 5 [drp] via OTIC
  Filled 2023-08-02: qty 5

## 2023-08-02 MED ORDER — ACETAMINOPHEN 500 MG PO TABS: 500.0000 mg | ORAL_TABLET | Freq: Four times a day (QID) | ORAL | 0 refills | Status: AC | PRN

## 2023-08-02 NOTE — Discharge Instructions (Addendum)
 You have been diagnosed today with bilateral otitis externa.  Please apply ofloxacin  drops 10 drops in each ear daily for 7 days.  Please take acetaminophen  500 mg every 6 hours for pain and fever.  Please come back to ED or go to your PCP if you have new symptoms or symptoms worsen.  You can make an appointment with your PCP for a follow-up of your otitis externa.  These do not introduce any foreign objects in your ears.

## 2023-08-02 NOTE — ED Triage Notes (Signed)
 Pt presents to the ED via POV with complaints of bilateral ear pain x 1 week with worsening pain since Monday. Pt has taken Tylenol  intermittently and used OTC ear drops which made the pain worse. A&Ox4 at this time. Denies CP or SOB.

## 2023-08-02 NOTE — ED Provider Notes (Signed)
 [facility] Provider Note    Event Date/Time   First MD Initiated Contact with Patient 08/02/23 2057     (approximate)   History   Otalgia    HPI  Monique Hansen is a 23 y.o. female  with no significant past medical history who presents to the ED complaining of otalgia. According to the patient, right ear otalgia started 5 days ago, radiating to the right cervical neck, associated with fever. Left otalgia started today.  Patient states that she was having her eyelashes done last Saturday when symptoms started.  Patient denies using a swimming pool or going to the lake.  Patient cleaned her ears last week.     Patient Active Problem List   Diagnosis Date Noted   Postpartum anemia 06/23/2023   Encounter for induction of labor 06/22/2023   NSVD (normal spontaneous vaginal delivery) 06/22/2023   Post-dates pregnancy 06/20/2023     ROS: Patient currently denies any vision changes, tinnitus, difficulty speaking, facial droop, sore throat, chest pain, shortness of breath, abdominal pain, nausea/vomiting/diarrhea, dysuria, or weakness/numbness/paresthesias in any extremity   Physical Exam   Triage Vital Signs: ED Triage Vitals  Encounter Vitals Group     BP 08/02/23 1913 (!) 154/88     Girls Systolic BP Percentile --      Girls Diastolic BP Percentile --      Boys Systolic BP Percentile --      Boys Diastolic BP Percentile --      Pulse Rate 08/02/23 1913 (!) 102     Resp 08/02/23 1913 18     Temp 08/02/23 1913 (!) 103 F (39.4 C)     Temp Source 08/02/23 1913 Oral     SpO2 08/02/23 1913 99 %     Weight 08/02/23 1912 200 lb (90.7 kg)     Height 08/02/23 1912 5' 2 (1.575 m)     Head Circumference --      Peak Flow --      Pain Score 08/02/23 1912 8     Pain Loc --      Pain Education --      Exclude from Growth Chart --     Most recent vital signs: Vitals:   08/02/23 1913  BP: (!) 154/88  Pulse: (!) 102  Resp: 18  Temp: (!)  103 F (39.4 C)  SpO2: 99%     Physical Exam Vitals and nursing note reviewed.  During triage patient was hypertensive, tachycardic, febrile  Constitutional:      General: Awake and alert. No acute distress.    Appearance: Normal appearance. The patient is normal weight.      Able to speak in complete sentences without cough or dyspnea  HENT:     Head: Normocephalic and atraumatic.     Mouth: Mucous membranes are moist.  Ears: Right ear: Presence of erythema in the external ear canal, edema.  Tympanic membrane is intact, within normal limits.  No otorrhea Right ear: Mild erythema and edema in the external ear canal.  Tympanic membrane is intact, no otorrhea.  Eyes:     General: PERRL. Normal EOMs          Conjunctiva/sclera: Conjunctivae normal.  Nose No congestion/rhinorrhea  CV:                  Good peripheral perfusion.  Regular rate and rhythm  Resp:  Normal effort.  Equal breath sounds bilaterally.  Abd:                 No distention.  Soft, nontender.  No rebound or guarding.  Musculoskeletal:        General: No swelling. Normal range of motion.  Skin:    General: Skin is warm and dry.     Capillary Refill: Capillary refill takes less than 2 seconds.     Findings: No rash.  Neurological:     Mental Status: The patient is awake and alert. MAE spontaneously. No gross focal neurologic deficits are appreciated.  Psychiatric Mood and affect are normal. Speech and behavior are normal.  ED Results / Procedures / Treatments   Labs (all labs ordered are listed, but only abnormal results are displayed) Labs Reviewed - No data to display   EKG See physician read    RADIOLOGY     PROCEDURES:  Critical Care performed:   Procedures   MEDICATIONS ORDERED IN ED: Medications  ofloxacin  (OCUFLOX ) 0.3 % ophthalmic solution 5 drop (has no administration in time range)  acetaminophen  (TYLENOL ) tablet 1,000 mg (1,000 mg Oral Given 08/02/23 1917)       IMPRESSION / MDM / ASSESSMENT AND PLAN / ED COURSE  I reviewed the triage vital signs and the nursing notes.  Differential diagnosis includes, but is not limited to, otitis externa, otitis media, foreign body  Patient's presentation is most consistent with acute, uncomplicated illness.     Monique Hansen is a 23 y.o., female presents today with history of right otalgia of 5 days, that radiates to the right neck, nauseated with fever.  Left otalgia started today.  No otorrhea.  Physical exam there is evidence in the right ear of erythema and edema of the external ear canal, left ear with mild erythema.  Tympanic membrane is intact bilateral.  No otorrhea Patient's diagnosis is consistent with bilateral otitis externa.  I did not order any imaging or labs because physical exam is reassuring I did review the patient's allergies and medications.patient is allergic to ibuprofen.  During admission patient received ofloxacin  drops 5 drops in each ear, acetaminophen  for fever and pain . The patient is in stable and satisfactory condition for discharge home  Patient will be discharged home with prescriptions for acetaminophen .  Patient is discharged with ofloxacin  drops, she needs to apply 10 drops in each ear daily for 7 days.  Patient is to follow up with PCP as needed or otherwise directed. Patient is given ED precautions to return to the ED for any worsening or new symptoms. Discussed plan of care with patient, answered all of patient's questions, Patient agreeable to plan of care. Advised patient to take medications according to the instructions on the label. Discussed possible side effects of new medications. Patient verbalized understanding.     FINAL CLINICAL IMPRESSION(S) / ED DIAGNOSES   Final diagnoses:  Diffuse otitis externa of left ear, unspecified chronicity  Acute diffuse otitis externa of right ear     Rx / DC Orders   ED Discharge Orders          Ordered     acetaminophen  (TYLENOL ) 500 MG tablet  Every 6 hours PRN        08/02/23 2119             Note:  This document was prepared using Dragon voice recognition software and may include unintentional dictation errors.   Janit Kast, PA-C 08/02/23 2120  Viviann Pastor, MD 08/02/23 2253

## 2023-08-02 NOTE — ED Notes (Signed)
 Pt provided discharge instructions and prescription information. Pt was given the opportunity to ask questions and questions were answered.
